# Patient Record
Sex: Female | Born: 1990 | Race: White | Hispanic: No | Marital: Single | Smoking: Former smoker
Health system: Southern US, Community
[De-identification: ages and names within clinical notes are randomized; demographics above are authoritative.]

## PROBLEM LIST (undated history)

## (undated) DIAGNOSIS — Z9889 Other specified postprocedural states: Secondary | ICD-10-CM

## (undated) DIAGNOSIS — F419 Anxiety disorder, unspecified: Secondary | ICD-10-CM

## (undated) DIAGNOSIS — T7840XA Allergy, unspecified, initial encounter: Secondary | ICD-10-CM

## (undated) DIAGNOSIS — G43909 Migraine, unspecified, not intractable, without status migrainosus: Secondary | ICD-10-CM

## (undated) HISTORY — DX: Other specified postprocedural states: Z98.890

## (undated) HISTORY — DX: Allergy, unspecified, initial encounter: T78.40XA

## (undated) HISTORY — PX: NO PAST SURGERIES: SHX2092

---
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
## 2017-11-19 ENCOUNTER — Ambulatory Visit: Payer: BC Managed Care – PPO | Admitting: Family Medicine

## 2017-11-19 ENCOUNTER — Other Ambulatory Visit: Payer: Self-pay

## 2017-11-19 ENCOUNTER — Encounter: Payer: Self-pay | Admitting: Family Medicine

## 2017-11-19 VITALS — BP 118/74 | HR 57 | Temp 98.5°F | Resp 16 | Ht 72.0 in | Wt 243.0 lb

## 2017-11-19 DIAGNOSIS — Z789 Other specified health status: Secondary | ICD-10-CM

## 2017-11-19 DIAGNOSIS — J301 Allergic rhinitis due to pollen: Secondary | ICD-10-CM

## 2017-11-19 DIAGNOSIS — J309 Allergic rhinitis, unspecified: Secondary | ICD-10-CM | POA: Insufficient documentation

## 2017-11-19 DIAGNOSIS — Z8371 Family history of colonic polyps: Secondary | ICD-10-CM

## 2017-11-19 DIAGNOSIS — S0300XA Dislocation of jaw, unspecified side, initial encounter: Secondary | ICD-10-CM | POA: Diagnosis not present

## 2017-11-19 DIAGNOSIS — IMO0001 Reserved for inherently not codable concepts without codable children: Secondary | ICD-10-CM | POA: Insufficient documentation

## 2017-11-19 DIAGNOSIS — F329 Major depressive disorder, single episode, unspecified: Secondary | ICD-10-CM

## 2017-11-19 DIAGNOSIS — G43909 Migraine, unspecified, not intractable, without status migrainosus: Secondary | ICD-10-CM | POA: Insufficient documentation

## 2017-11-19 DIAGNOSIS — F419 Anxiety disorder, unspecified: Secondary | ICD-10-CM

## 2017-11-19 DIAGNOSIS — G43709 Chronic migraine without aura, not intractable, without status migrainosus: Secondary | ICD-10-CM

## 2017-11-19 DIAGNOSIS — Z83719 Family history of colon polyps, unspecified: Secondary | ICD-10-CM

## 2017-11-19 MED ORDER — CITALOPRAM HYDROBROMIDE 20 MG PO TABS: 20.0000 mg | ORAL_TABLET | Freq: Every day | ORAL | 3 refills | Status: DC

## 2017-11-19 MED ORDER — NORETHINDRONE ACET-ETHINYL EST 1.5-30 MG-MCG PO TABS: 1.0000 | ORAL_TABLET | Freq: Every day | ORAL | 11 refills | Status: DC

## 2017-11-19 MED ORDER — NA SULFATE-K SULFATE-MG SULF 17.5-3.13-1.6 GM/177ML PO SOLN: 1.0000 | Freq: Once | ORAL | 0 refills | Status: AC

## 2017-11-19 MED ORDER — SUMATRIPTAN SUCCINATE 50 MG PO TABS: 50.0000 mg | ORAL_TABLET | ORAL | 5 refills | Status: DC | PRN

## 2017-11-19 NOTE — Assessment & Plan Note (Signed)
Well-controlled Continue Zyrtec as needed If she has any eustachian tube dysfunction symptoms, can consider Flonase as well

## 2017-11-19 NOTE — Assessment & Plan Note (Signed)
Discussed options for contraception with the patient The ease of a Guss Bunde is desirable, but the side effects are not desirable to the patient She will try OCPs again Discussed that she may be able to take these continuously and not have a monthly periods in the future if she desires She does not have aura with her migraines or any other contraindications

## 2017-11-19 NOTE — Assessment & Plan Note (Signed)
Patient sister found to have adenomas in her colon at age 25 It was recommended that first-degree relatives have screening with colonoscopy Referral to GI for colonoscopy and further management

## 2017-11-19 NOTE — Assessment & Plan Note (Signed)
Patient with obvious tenderness and clicking of her right TMJ It is likely that this is related to her grinding her teeth She will discuss with her dentist and possibly get a mouthguard to use when sleeping at night to help with this Advised to avoid clicking and popping her jaw This may be contributing to her migraines as above

## 2017-11-19 NOTE — Progress Notes (Signed)
Patient: Tammy Washington, Female    DOB: 1990-08-26, 27 y.o.   MRN: [id number] Visit Date: 11/19/2017  Today's Provider: Lavon Paganini, MD   Chief Complaint  Patient presents with  . New Patient (Initial Visit)   Subjective:    New Patient Tammy Washington is a 27 y.o. female who presents today as a new patient to establish care. She feels fairly well.  She reports exercising daily. Walks 2 miles at work. She reports she is sleeping well.  Allergic rhinitis: Symptoms are seasonal and seem to be related to pollen content in the air.  She is currently asymptomatic.  During the spring, she will take Zyrtec as needed.  Contraception: Patient is not currently sexually active, but she is recently engaged.  She is interested in starting contraception.  She has tried NuvaRing in the past, but felt "weird to use ".  She also had a Nexplanon for several years, but she did not like callus cause weight gain and irregular menses.  She likes having regular periods and is not interested in IUD.  She tried OCPs in the past and felt she was not responsible enough to take these consistently.  She believes that she may be responsible enough for this at this time.  She has no personal or family history of stroke, VTE, hypertension, migraine with aura    She would like to discuss migraine headaches that start in her right ear,  and is pounding. She also experiences lightheadedness.  The pain is unilateral and throbbing in nature.  She has both phonophobia and photophobia as well as dizziness and nausea.  She has not experienced vomiting.  She denies any aura.  She feels a clicking with moving her right jaw and thinks she may be a teeth grinder.  She has an appointment coming up with her dentist she states that previous physicians have tried cleaning out her ears but this has not helped she has never taken a triptan in the past.  When she gets the pain currently she will take 2 ibuprofen which does not seem  to help and then lay down in a dark room and try to sleep it off.  Her migraines are occurring about 1-2 times weekly right now and seem to be worse later in the day.  She is also concerned about her mood lately.  She has been experiencing anhedonia, lack of motivation, difficulty getting out of bed in the morning, worry and anxiety.  She states that her family moved away recently and this is been difficult.  She feels like she should not feel this way as life has been going really well.  She has never been diagnosed with depression or anxiety in the past.  She has never taken a medication for this.  She has never gone to therapy.  She has tried yoga, meditation, mindfulness, without much relief.  She states this is not affecting her job performance, but is affecting her social life. -----------------------------------------------------------------   Review of Systems  Constitutional: Negative.   HENT: Positive for ear pain. Negative for congestion, dental problem, drooling, ear discharge, facial swelling, hearing loss, mouth sores, nosebleeds, postnasal drip, rhinorrhea, sinus pressure, sinus pain, sneezing, sore throat, tinnitus, trouble swallowing and voice change.   Eyes: Negative.   Respiratory: Negative.   Cardiovascular: Negative.   Gastrointestinal: Negative.   Endocrine: Negative.   Genitourinary: Negative.   Musculoskeletal: Negative.   Skin: Negative.   Allergic/Immunologic: Negative.   Neurological: Positive for dizziness  and headaches. Negative for tremors, seizures, syncope, facial asymmetry, speech difficulty, weakness, light-headedness and numbness.  Hematological: Negative.   Psychiatric/Behavioral: Negative.     Social History      She  reports that she quit smoking about 2 years ago. Her smoking use included cigarettes. She quit after 3.00 years of use. She has never used smokeless tobacco. She reports that she drinks about 0.6 - 1.2 oz of alcohol per week. She reports that  she does not use drugs.       Social History   Socioeconomic History  . Marital status: Single    Spouse name: Not on file  . Number of children: 0  . Years of education: 16  . Highest education level: Bachelor's degree (e.g., BA, AB, BS)  Occupational History  . Occupation: Optometrist at [employer]: [place]  . Financial resource strain: Not on file  . Food insecurity:    Worry: Not on file    Inability: Not on file  . Transportation needs:    Medical: Not on file    Non-medical: Not on file  Tobacco Use  . Smoking status: Former Smoker    Years: 3.00    Types: Cigarettes    Last attempt to quit: 04/24/2015    Years since quitting: 2.5  . Smokeless tobacco: Never Used  . Tobacco comment: former social smoker  Substance and Sexual Activity  . Alcohol use: Yes    Alcohol/week: 0.6 - 1.2 oz    Types: 1 - 2 Cans of beer per week  . Drug use: Never  . Sexual activity: Not Currently    Partners: Male    Birth control/protection: Abstinence  Lifestyle  . Physical activity:    Days per week: Not on file    Minutes per session: Not on file  . Stress: Not on file  Relationships  . Social connections:    Talks on phone: Not on file    Gets together: Not on file    Attends religious service: Not on file    Active member of club or organization: Not on file    Attends meetings of clubs or organizations: Not on file    Relationship status: Not on file  Other Topics Concern  . Not on file  Social History Narrative  . Not on file    Past Medical History:  Diagnosis Date  . Allergy      Patient Active Problem List   Diagnosis Date Noted  . Allergic rhinitis 11/19/2017  . 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 Migraines 11/19/2017  . Family history of colonic polyps 11/19/2017  . Contraception 11/19/2017  . Anxiety and depression 11/19/2017    Past Surgical History:  Procedure Laterality Date  . NO PAST SURGERIES       Family History        Family Status  Relation Name Status  . Mother  Alive  . Father  Alive  . Sister  Alive  . MGM  (Not Specified)  . Sister  Alive  . Neg Hx  (Not Specified)        Her family history includes Colon polyps (age of onset: 26) in her sister; Dementia in her maternal grandmother; Healthy in her mother and sister; Rheum arthritis in her father. There is no history of Colon cancer, Breast cancer, Ovarian cancer, or Cervical cancer.      No Known Allergies   Current Outpatient Medications:  .  citalopram (CELEXA) 20 MG tablet, Take 1 tablet (20 mg total) by mouth daily., Disp: 30 tablet, Rfl: 3 .  Norethindrone Acetate-Ethinyl Estradiol (JUNEL 1.5/30) 1.5-30 MG-MCG tablet, Take 1 tablet by mouth daily., Disp: 1 Package, Rfl: 11 .  SUMAtriptan (IMITREX) 50 MG tablet, Take 1 tablet (50 mg total) by mouth every 2 (two) hours as needed for migraine. May repeat in 2 hours if headache persists or recurs., Disp: 10 tablet, Rfl: 5   Patient Care Team: Virginia Crews, MD as PCP - General (Family Medicine)      Objective:   Vitals: BP 118/74 (BP Location: Left Arm, Patient Position: Sitting, Cuff Size: Large)   Pulse (!) 57   Temp 98.5 F (36.9 C) (Oral)   Resp 16   Ht 6' (1.829 m)   Wt 243 lb (110.2 kg)   LMP 11/11/2017   SpO2 99%   BMI 32.96 kg/m    Vitals:   11/19/17 1409  BP: 118/74  Pulse: (!) 57  Resp: 16  Temp: 98.5 F (36.9 C)  TempSrc: Oral  SpO2: 99%  Weight: 243 lb (110.2 kg)  Height: 6' (1.829 m)     Physical Exam  Constitutional: She is oriented to person, place, and time. She appears well-developed and well-nourished. No distress.  HENT:  Head: Normocephalic and atraumatic.  Right Ear: Tympanic membrane, external ear and ear canal normal.  Left Ear: Tympanic membrane, external ear and ear canal normal.  Nose: Nose normal.  Mouth/Throat: Uvula is midline, oropharynx is clear and moist and mucous membranes are normal. Tonsils are  3+ on the right. Tonsils are 3+ on the left. No tonsillar exudate.  +clicking of L TMJ  Eyes: Pupils are equal, round, and reactive to light. Conjunctivae and EOM are normal. No scleral icterus.  Neck: Neck supple. No thyromegaly present.  Cardiovascular: Normal rate, regular rhythm, normal heart sounds and intact distal pulses.  No murmur heard. Pulmonary/Chest: Effort normal and breath sounds normal. No respiratory distress. She has no wheezes. She has no rales.  Abdominal: Soft. Bowel sounds are normal. She exhibits no distension. There is no tenderness. There is no rebound and no guarding.  Musculoskeletal: She exhibits no edema or deformity.  Lymphadenopathy:    She has no cervical adenopathy.  Neurological: She is alert and oriented to person, place, and time. She has normal strength. She displays no atrophy and no tremor. No cranial nerve deficit or sensory deficit. She exhibits normal muscle tone. Coordination and gait normal.  Skin: Skin is warm and dry. Capillary refill takes less than 2 seconds. No rash noted.  Psychiatric: Her speech is normal and behavior is normal. Judgment normal. Her mood appears anxious. Her affect is not angry and not inappropriate. Cognition and memory are normal. She exhibits a depressed mood. She expresses no homicidal and no suicidal ideation. She expresses no suicidal plans and no homicidal plans.  Vitals reviewed.   Depression screen PHQ 2/9 11/19/2017  Decreased Interest 2  Down, Depressed, Hopeless 1  PHQ - 2 Score 3  Altered sleeping 2  Tired, decreased energy 2  Change in appetite 1  Feeling bad or failure about yourself  2  Trouble concentrating 0  Moving slowly or fidgety/restless 0  Suicidal thoughts 0  PHQ-9 Score 10  Difficult doing work/chores Somewhat difficult    GAD 7 : Generalized Anxiety Score 11/19/2017  Nervous, Anxious, on Edge 2  Control/stop worrying 2  Worry too much - different things 2  Trouble relaxing  2  Restless 2    Easily annoyed or irritable 3  Afraid - awful might happen 2  Total GAD 7 Score 15  Anxiety Difficulty Very difficult     Assessment & Plan:    Problem List Items Addressed This Visit      Cardiovascular and Mediastinum   Migraines - Primary    Patient previously diagnosed with migraines They are more chronic at this point She has no aura Her neuro exam is benign today She does seem to have classic migrainous symptoms that are associated with her headache 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-see below for treatment of that I will prescribe a triptan to use as needed at first onset of migraines Discussed return precautions If continue to be frequent, could consider preventive medication      Relevant Medications   SUMAtriptan (IMITREX) 50 MG tablet   citalopram (CELEXA) 20 MG tablet     Respiratory   Allergic rhinitis    Well-controlled Continue Zyrtec as needed If she has any eustachian tube dysfunction symptoms, can consider Flonase as well        Musculoskeletal and Integument   TMJ (dislocation of temporomandibular joint)    Patient with obvious tenderness and clicking of her right TMJ It is likely that this is related to her grinding her teeth She will discuss with her dentist and possibly get a mouthguard to use when sleeping at night to help with this Advised to avoid clicking and popping her jaw This may be contributing to her migraines as above        Other   Family history of colonic polyps    Patient sister found to have adenomas in her colon at age 43 It was recommended that first-degree relatives have screening with colonoscopy Referral to GI for colonoscopy and further management       Relevant Orders   Ambulatory referral to Gastroenterology   Contraception    Discussed options for contraception with the patient The ease of a Guss Bunde is desirable, but the side effects are not desirable to the patient She will try  OCPs again Discussed that she may be able to take these continuously and not have a monthly periods in the future if she desires She does not have aura with her migraines or any other contraindications      Anxiety and depression    New diagnoses Symptoms are consistent with depression anxiety we will get labs at her next visit to screen for any secondary causes of depression anxiety Advised on the synergistic effect of medications and therapy Patient to look into starting to see a therapist She is agreeable to starting medication at this time We will start Celexa 20 mg daily Discussed that it can take 6 to 8 weeks to reach full efficacy Discussed possible side effects including GI upset and sexual dysfunction, as well as black box warning for increased suicidality Contracted for safety with no SI or HI today  follow-up in 6 weeks and consider dose titration of Celexa      Relevant Medications   citalopram (CELEXA) 20 MG tablet       Return in about 6 weeks (around 12/31/2017) for mood and migraines.  Addressed extensive list of chronic and acute medical problems today requiring extensive time in counseling and coordination of care.  Over half of this 60 minute visit were spent in counseling and coordinating care of multiple medical problems.  The entirety of  the information documented in the History of Present Illness, Review of Systems and Physical Exam were personally obtained by me. Portions of this information were initially documented by Raquel Sarna Ratchford, CMA and reviewed by me for thoroughness and accuracy.    Virginia Crews, MD, MPH [facility] 11/19/2017 4:45 PM

## 2017-11-19 NOTE — Assessment & Plan Note (Signed)
New diagnoses Symptoms are consistent with depression anxiety we will get labs at her next visit to screen for any secondary causes of depression anxiety Advised on the synergistic effect of medications and therapy Patient to look into starting to see a therapist She is agreeable to starting medication at this time We will start Celexa 20 mg daily Discussed that it can take 6 to 8 weeks to reach full efficacy Discussed possible side effects including GI upset and sexual dysfunction, as well as black box warning for increased suicidality Contracted for safety with no SI or HI today  follow-up in 6 weeks and consider dose titration of Celexa

## 2017-11-19 NOTE — Patient Instructions (Addendum)
[email or web address] Use this to find a therapist   Temporomandibular Joint Syndrome Temporomandibular joint (TMJ) syndrome is a condition that affects the joints between your jaw and your skull. The TMJs are located near your ears and allow your jaw to open and close. These joints and the nearby muscles are involved in all movements of the jaw. People with TMJ syndrome have pain in the area of these joints and muscles. Chewing, biting, or other movements of the jaw can be difficult or painful. TMJ syndrome can be caused by various things. In many cases, the condition is mild and goes away within a few weeks. For some people, the condition can become a long-term problem. What are the causes? Possible causes of TMJ syndrome include:  Grinding your teeth or clenching your jaw. Some people do this when they are under stress.  Arthritis.  Injury to the jaw.  Head or neck injury.  Teeth or dentures that are not aligned well.  In some cases, the cause of TMJ syndrome may not be known. What are the signs or symptoms? The most common symptom is an aching pain on the side of the head in the area of the TMJ. Other symptoms may include:  Pain when moving your jaw, such as when chewing or biting.  Being unable to open your jaw all the way.  Making a clicking sound when you open your mouth.  Headache.  Earache.  Neck or shoulder pain.  How is this diagnosed? Diagnosis can usually be made based on your symptoms, your medical history, and a physical exam. Your health care provider may check the range of motion of your jaw. Imaging tests, such as X-rays or an MRI, are sometimes done. You may need to see your dentist to determine if your teeth and jaw are lined up correctly. How is this treated? TMJ syndrome often goes away on its own. If treatment is needed, the options may include:  Eating soft foods and applying ice or heat.  Medicines to relieve pain or inflammation.  Medicines to  relax the muscles.  A splint, bite plate, or mouthpiece to prevent teeth grinding or jaw clenching.  Relaxation techniques or counseling to help reduce stress.  Transcutaneous electrical nerve stimulation (TENS). This helps to relieve pain by applying an electrical current through the skin.  Acupuncture. This is sometimes helpful to relieve pain.  Jaw surgery. This is rarely needed.  Follow these instructions at home:  Take medicines only as directed by your health care provider.  Eat a soft diet if you are having trouble chewing.  Apply ice to the painful area. ? Put ice in a plastic bag. ? Place a towel between your skin and the bag. ? Leave the ice on for 20 minutes, 2-3 times a day.  Apply a warm compress to the painful area as directed.  Massage your jaw area and perform any jaw stretching exercises as recommended by your health care provider.  If you were given a mouthpiece or bite plate, wear it as directed.  Avoid foods that require a lot of chewing. Do not chew gum.  Keep all follow-up visits as directed by your health care provider. This is important. Contact a health care provider if:  You are having trouble eating.  You have new or worsening symptoms. Get help right away if:  Your jaw locks open or closed. This information is not intended to replace advice given to you by your health care provider. Make sure you discuss any  questions you have with your health care provider. Document Released: 01/03/2001 Document Revised: 12/09/2015 Document Reviewed: 11/13/2013 Elsevier Interactive Patient Education  [employer].

## 2017-11-19 NOTE — Assessment & Plan Note (Signed)
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

## 2017-11-21 ENCOUNTER — Other Ambulatory Visit: Payer: Self-pay

## 2017-11-21 ENCOUNTER — Encounter: Payer: Self-pay | Admitting: *Deleted

## 2017-12-05 ENCOUNTER — Encounter: Payer: Self-pay | Admitting: *Deleted

## 2017-12-05 ENCOUNTER — Other Ambulatory Visit: Payer: Self-pay

## 2017-12-11 NOTE — Discharge Instructions (Signed)
General Anesthesia, Adult, Care After °These instructions provide you with information about caring for yourself after your procedure. Your health care provider may also give you more specific instructions. Your treatment has been planned according to current medical practices, but problems sometimes occur. Call your health care provider if you have any problems or questions after your procedure. °What can I expect after the procedure? °After the procedure, it is common to have: °· Vomiting. °· A sore throat. °· Mental slowness. ° °It is common to feel: °· Nauseous. °· Cold or shivery. °· Sleepy. °· Tired. °· Sore or achy, even in parts of your body where you did not have surgery. ° °Follow these instructions at home: °For at least 24 hours after the procedure: °· Do not: °? Participate in activities where you could fall or become injured. °? Drive. °? Use heavy machinery. °? Drink alcohol. °? Take sleeping pills or medicines that cause drowsiness. °? Make important decisions or sign legal documents. °? Take care of children on your own. °· Rest. °Eating and drinking °· If you vomit, drink water, juice, or soup when you can drink without vomiting. °· Drink enough fluid to keep your urine clear or pale yellow. °· Make sure you have little or no nausea before eating solid foods. °· Follow the diet recommended by your health care provider. °General instructions °· Have a responsible adult stay with you until you are awake and alert. °· Return to your normal activities as told by your health care provider. Ask your health care provider what activities are safe for you. °· Take over-the-counter and prescription medicines only as told by your health care provider. °· If you smoke, do not smoke without supervision. °· Keep all follow-up visits as told by your health care provider. This is important. °Contact a health care provider if: °· You continue to have nausea or vomiting at home, and medicines are not helpful. °· You  cannot drink fluids or start eating again. °· You cannot urinate after 8-12 hours. °· You develop a skin rash. °· You have fever. °· You have increasing redness at the site of your procedure. °Get help right away if: °· You have difficulty breathing. °· You have chest pain. °· You have unexpected bleeding. °· You feel that you are having a life-threatening or urgent problem. °This information is not intended to replace advice given to you by your health care provider. Make sure you discuss any questions you have with your health care provider. °Document Released: 07/17/2000 Document Revised: 09/13/2015 Document Reviewed: 03/25/2015 °Elsevier Interactive Patient Education © 2018 Elsevier Inc. ° °

## 2017-12-12 ENCOUNTER — Ambulatory Visit: Payer: BC Managed Care – PPO | Admitting: Anesthesiology

## 2017-12-12 ENCOUNTER — Ambulatory Visit
Admission: RE | Admit: 2017-12-12 | Discharge: 2017-12-12 | Disposition: A | Discharge status: Home / Self Care | Payer: BC Managed Care – PPO | Source: Ambulatory Visit | Attending: Gastroenterology | Admitting: Gastroenterology

## 2017-12-12 ENCOUNTER — Ambulatory Visit
Admission: RE | Disposition: A | Discharge status: Home / Self Care | Payer: Self-pay | Source: Ambulatory Visit | Attending: Gastroenterology

## 2017-12-12 DIAGNOSIS — Z8371 Family history of colonic polyps: Secondary | ICD-10-CM | POA: Insufficient documentation

## 2017-12-12 DIAGNOSIS — Z79899 Other long term (current) drug therapy: Secondary | ICD-10-CM | POA: Diagnosis not present

## 2017-12-12 DIAGNOSIS — Z87891 Personal history of nicotine dependence: Secondary | ICD-10-CM | POA: Diagnosis not present

## 2017-12-12 DIAGNOSIS — Z1211 Encounter for screening for malignant neoplasm of colon: Secondary | ICD-10-CM | POA: Insufficient documentation

## 2017-12-12 DIAGNOSIS — Z83719 Family history of colon polyps, unspecified: Secondary | ICD-10-CM

## 2017-12-12 HISTORY — PX: COLONOSCOPY WITH PROPOFOL: SHX5780

## 2017-12-12 HISTORY — DX: Migraine, unspecified, not intractable, without status migrainosus: G43.909

## 2017-12-12 SURGERY — COLONOSCOPY WITH PROPOFOL
Anesthesia: General | Wound class: Clean Contaminated

## 2017-12-12 MED ORDER — ACETAMINOPHEN 160 MG/5ML PO SOLN: 325.0000 mg | Freq: Once | ORAL | Status: DC

## 2017-12-12 MED ORDER — STERILE WATER FOR IRRIGATION IR SOLN
Status: DC | PRN
  Administered 2017-12-12: 10:00:00

## 2017-12-12 MED ORDER — LIDOCAINE HCL (CARDIAC) PF 100 MG/5ML IV SOSY
PREFILLED_SYRINGE | INTRAVENOUS | Status: DC | PRN
  Administered 2017-12-12: 40 mg via INTRAVENOUS

## 2017-12-12 MED ORDER — PROPOFOL 10 MG/ML IV BOLUS
INTRAVENOUS | Status: DC | PRN
  Administered 2017-12-12: 30 mg via INTRAVENOUS
  Administered 2017-12-12 (×2): 20 mg via INTRAVENOUS
  Administered 2017-12-12: 100 mg via INTRAVENOUS
  Administered 2017-12-12: 20 mg via INTRAVENOUS
  Administered 2017-12-12: 50 mg via INTRAVENOUS
  Administered 2017-12-12: 30 mg via INTRAVENOUS
  Administered 2017-12-12: 20 mg via INTRAVENOUS
  Administered 2017-12-12: 50 mg via INTRAVENOUS
  Administered 2017-12-12: 20 mg via INTRAVENOUS
  Administered 2017-12-12: 10 mg via INTRAVENOUS

## 2017-12-12 MED ORDER — LACTATED RINGERS IV SOLN
INTRAVENOUS | Status: DC
  Administered 2017-12-12: 09:00:00 via INTRAVENOUS

## 2017-12-12 MED ORDER — ACETAMINOPHEN 325 MG PO TABS: 325.0000 mg | ORAL_TABLET | Freq: Once | ORAL | Status: DC

## 2017-12-12 MED ORDER — GLYCOPYRROLATE 0.2 MG/ML IJ SOLN
INTRAMUSCULAR | Status: DC | PRN
  Administered 2017-12-12: 0.2 mg via INTRAVENOUS

## 2017-12-12 MED ORDER — SODIUM CHLORIDE 0.9 % IV SOLN: INTRAVENOUS | Status: DC

## 2017-12-12 SURGICAL SUPPLY — 25 items
CANISTER SUCT 1200ML W/VALVE (MISCELLANEOUS) ×3 IMPLANT
CLIP HMST 235XBRD CATH ROT (MISCELLANEOUS) IMPLANT
CLIP RESOLUTION 360 11X235 (MISCELLANEOUS)
ELECT REM PT RETURN 9FT ADLT (ELECTROSURGICAL)
ELECTRODE REM PT RTRN 9FT ADLT (ELECTROSURGICAL) IMPLANT
FCP ESCP3.2XJMB 240X2.8X (MISCELLANEOUS)
FORCEPS BIOP RAD 4 LRG CAP 4 (CUTTING FORCEPS) IMPLANT
FORCEPS BIOP RJ4 240 W/NDL (MISCELLANEOUS)
FORCEPS ESCP3.2XJMB 240X2.8X (MISCELLANEOUS) IMPLANT
GOWN CVR UNV OPN BCK APRN NK (MISCELLANEOUS) ×2 IMPLANT
GOWN ISOL THUMB LOOP REG UNIV (MISCELLANEOUS) ×4
INJECTOR VARIJECT VIN23 (MISCELLANEOUS) IMPLANT
KIT DEFENDO VALVE AND CONN (KITS) IMPLANT
KIT ENDO PROCEDURE OLY (KITS) ×3 IMPLANT
MARKER SPOT ENDO TATTOO 5ML (MISCELLANEOUS) IMPLANT
PROBE APC STR FIRE (PROBE) IMPLANT
RETRIEVER NET ROTH 2.5X230 LF (MISCELLANEOUS) IMPLANT
SNARE COLD EXACTO (MISCELLANEOUS) IMPLANT
SNARE SHORT THROW 13M SML OVAL (MISCELLANEOUS) IMPLANT
SNARE SHORT THROW 30M LRG OVAL (MISCELLANEOUS) IMPLANT
SNARE SNG USE RND 15MM (INSTRUMENTS) IMPLANT
SPOT EX ENDOSCOPIC TATTOO (MISCELLANEOUS)
TRAP ETRAP POLY (MISCELLANEOUS) IMPLANT
VARIJECT INJECTOR VIN23 (MISCELLANEOUS)
WATER STERILE IRR 250ML POUR (IV SOLUTION) ×3 IMPLANT

## 2017-12-12 NOTE — Transfer of Care (Signed)
Immediate Anesthesia Transfer of Care Note  Patient: Tammy Washington  Procedure(s) Performed: COLONOSCOPY WITH PROPOFOL (N/A )  Patient Location: PACU  Anesthesia Type: General  Level of Consciousness: awake, alert  and patient cooperative  Airway and Oxygen Therapy: Patient Spontanous Breathing and Patient connected to supplemental oxygen  Post-op Assessment: Post-op Vital signs reviewed, Patient's Cardiovascular Status Stable, Respiratory Function Stable, Patent Airway and No signs of Nausea or vomiting  Post-op Vital Signs: Reviewed and stable  Complications: No apparent anesthesia complications

## 2017-12-12 NOTE — Anesthesia Procedure Notes (Signed)
Procedure Name: MAC Date/Time: 12/12/2017 9:40 AM Performed by: Janna Arch, CRNA Pre-anesthesia Checklist: Patient identified, Emergency Drugs available, Suction available and Patient being monitored Patient Re-evaluated:Patient Re-evaluated prior to induction Oxygen Delivery Method: Nasal cannula

## 2017-12-12 NOTE — Anesthesia Postprocedure Evaluation (Signed)
Anesthesia Post Note  Patient: Tammy Washington  Procedure(s) Performed: COLONOSCOPY WITH PROPOFOL (N/A )  Patient location during evaluation: PACU Anesthesia Type: General Level of consciousness: awake and alert and oriented Pain management: satisfactory to patient Vital Signs Assessment: post-procedure vital signs reviewed and stable Respiratory status: spontaneous breathing, nonlabored ventilation and respiratory function stable Cardiovascular status: blood pressure returned to baseline and stable Postop Assessment: Adequate PO intake and No signs of nausea or vomiting Anesthetic complications: no    Raliegh Ip

## 2017-12-12 NOTE — H&P (Signed)
Tammy Darby, MD [address]  [place]  [place], [place] [address]  Main: [phone]  Fax: [phone] Pager: [phone]  Primary Care Physician:  Virginia Crews, MD Primary Gastroenterologist:  Dr. Cephas Washington  Pre-Procedure History & Physical: HPI:  Tammy Washington is a 27 y.o. female is here for an colonoscopy.   Past Medical History:  Diagnosis Date  . Allergy   . Migraine headache     Past Surgical History:  Procedure Laterality Date  . NO PAST SURGERIES      Prior to Admission medications   Medication Sig Start Date End Date Taking? Authorizing Provider  citalopram (CELEXA) 20 MG tablet Take 1 tablet (20 mg total) by mouth daily. 11/19/17  Yes Bacigalupo, Dionne Bucy, MD  Norethindrone Acetate-Ethinyl Estradiol (JUNEL 1.5/30) 1.5-30 MG-MCG tablet Take 1 tablet by mouth daily. 11/19/17  Yes Bacigalupo, Dionne Bucy, MD  SUMAtriptan (IMITREX) 50 MG tablet Take 1 tablet (50 mg total) by mouth every 2 (two) hours as needed for migraine. May repeat in 2 hours if headache persists or recurs. 11/19/17  Yes Virginia Crews, MD    Allergies as of 11/19/2017  . (No Known Allergies)    Family History  Problem Relation Age of Onset  . Healthy Mother   . Rheum arthritis Father   . Colon polyps Sister 64       serrated adenoma  . Dementia Maternal Grandmother   . Healthy Sister   . Colon cancer Neg Hx   . Breast cancer Neg Hx   . Ovarian cancer Neg Hx   . Cervical cancer Neg Hx     Social History   Socioeconomic History  . Marital status: Single    Spouse name: Not on file  . Number of children: 0  . Years of education: 16  . Highest education level: Bachelor's degree (e.g., BA, AB, BS)  Occupational History  . Occupation: Optometrist at [employer] [place]  . Financial resource strain: Not on file  . Food insecurity:    Worry: Not on file    Inability: Not on file  . Transportation needs:    Medical: Not on  file    Non-medical: Not on file  Tobacco Use  . Smoking status: Former Smoker    Years: 3.00    Types: Cigarettes    Last attempt to quit: 04/24/2015    Years since quitting: 2.6  . Smokeless tobacco: Never Used  . Tobacco comment: former social smoker  Substance and Sexual Activity  . Alcohol use: Yes    Alcohol/week: 1.0 - 2.0 standard drinks    Types: 1 - 2 Cans of beer per week  . Drug use: Never  . Sexual activity: Not Currently    Partners: Male    Birth control/protection: Abstinence  Lifestyle  . Physical activity:    Days per week: Not on file    Minutes per session: Not on file  . Stress: Not on file  Relationships  . Social connections:    Talks on phone: Not on file    Gets together: Not on file    Attends religious service: Not on file    Active member of club or organization: Not on file    Attends meetings of clubs or organizations: Not on file    Relationship status: Not on file  . Intimate partner violence:    Fear of current or ex partner: Not on  file    Emotionally abused: Not on file    Physically abused: Not on file    Forced sexual activity: Not on file  Other Topics Concern  . Not on file  Social History Narrative  . Not on file    Review of Systems: See HPI, otherwise negative ROS  Physical Exam: BP 115/78   Pulse 64   Temp 97.9 F (36.6 C) (Temporal)   Resp 16   Ht 6' (1.829 m)   Wt 106.6 kg   LMP 12/12/2017 Comment: preg test neg  SpO2 99%   BMI 31.87 kg/m  General:   Alert,  pleasant and cooperative in NAD Head:  Normocephalic and atraumatic. Neck:  Supple; no masses or thyromegaly. Lungs:  Clear throughout to auscultation.    Heart:  Regular rate and rhythm. Abdomen:  Soft, nontender and nondistended. Normal bowel sounds, without guarding, and without rebound.   Neurologic:  Alert and  oriented x4;  grossly normal neurologically.  Impression/Plan: Tammy Washington is here for an colonoscopy to be performed for family h/o  colon polyps  Risks, benefits, limitations, and alternatives regarding  colonoscopy have been reviewed with the patient.  Questions have been answered.  All parties agreeable.   Sherri Sear, MD  12/12/2017, 9:30 AM

## 2017-12-12 NOTE — Anesthesia Preprocedure Evaluation (Signed)
Anesthesia Evaluation  Patient identified by MRN, date of birth, ID band Patient awake    Reviewed: Allergy & Precautions, H&P , NPO status , Patient's Chart, lab work & pertinent test results  Airway Mallampati: II  TM Distance: >3 FB Neck ROM: full    Dental no notable dental hx.    Pulmonary former smoker,    Pulmonary exam normal breath sounds clear to auscultation       Cardiovascular Normal cardiovascular exam Rhythm:regular Rate:Normal     Neuro/Psych    GI/Hepatic   Endo/Other    Renal/GU      Musculoskeletal   Abdominal   Peds  Hematology   Anesthesia Other Findings   Reproductive/Obstetrics                             Anesthesia Physical Anesthesia Plan  ASA: II  Anesthesia Plan: General   Post-op Pain Management:    Induction: Intravenous  PONV Risk Score and Plan: 3 and Propofol infusion and Treatment may vary due to age or medical condition  Airway Management Planned: Natural Airway  Additional Equipment:   Intra-op Plan:   Post-operative Plan:   Informed Consent: I have reviewed the patients History and Physical, chart, labs and discussed the procedure including the risks, benefits and alternatives for the proposed anesthesia with the patient or authorized representative who has indicated his/her understanding and acceptance.     Plan Discussed with: CRNA  Anesthesia Plan Comments:         Anesthesia Quick Evaluation

## 2017-12-12 NOTE — Op Note (Signed)
[facility] Gastroenterology Patient Name: Tammy Washington Procedure Date: 12/12/2017 9:30 AM MRN: [id number] Account #: [id number] Date of Birth: 04-Jul-1990 Admit Type: Outpatient Age: 27 Room: [facility] OR ROOM 01 Gender: Female Note Status: Finalized Procedure:            Colonoscopy Indications:          Colon cancer screening in patient with 1st-degree                        relative having sessile serrated colon polyp (smaller                        than 10 mm with no dysplasia) before age 52 Providers:            Rohini Raeanne Gathers MD, MD Referring MD:         Dionne Bucy. Bacigalupo (Referring MD) Medicines:            Monitored Anesthesia Care Complications:        No immediate complications. Estimated blood loss: None. Procedure:            Pre-Anesthesia Assessment:                       - Prior to the procedure, a History and Physical was                        performed, and patient medications and allergies were                        reviewed. The patient is competent. The risks and                        benefits of the procedure and the sedation options and                        risks were discussed with the patient. All questions                        were answered and informed consent was obtained.                        Patient identification and proposed procedure were                        verified by the physician, the nurse, the                        anesthesiologist, the anesthetist and the technician in                        the pre-procedure area in the procedure room in the                        endoscopy suite. Mental Status Examination: alert and                        oriented. Airway Examination: normal oropharyngeal                        airway and neck mobility. Respiratory Examination:  clear to auscultation. CV Examination: normal.                        Prophylactic Antibiotics: The patient does not require                         prophylactic antibiotics. Prior Anticoagulants: The                        patient has taken no previous anticoagulant or                        antiplatelet agents. ASA Grade Assessment: II - A                        patient with mild systemic disease. After reviewing the                        risks and benefits, the patient was deemed in                        satisfactory condition to undergo the procedure. The                        anesthesia plan was to use monitored anesthesia care                        (MAC). Immediately prior to administration of                        medications, the patient was re-assessed for adequacy                        to receive sedatives. The heart rate, respiratory rate,                        oxygen saturations, blood pressure, adequacy of                        pulmonary ventilation, and response to care were                        monitored throughout the procedure. The physical status                        of the patient was re-assessed after the procedure.                       After obtaining informed consent, the colonoscope was                        passed under direct vision. Throughout the procedure,                        the patient's blood pressure, pulse, and oxygen                        saturations were monitored continuously. The was  introduced through the anus and advanced to the the                        terminal ileum. The colonoscopy was performed without                        difficulty. The patient tolerated the procedure well.                        The quality of the bowel preparation was adequate to                        identify polyps 6 mm and larger in size. Findings:      The perianal and digital rectal examinations were normal. Pertinent       negatives include normal sphincter tone and no palpable rectal lesions.      The terminal ileum appeared normal.      The colon  (entire examined portion) appeared normal.      The retroflexed view of the distal rectum and anal verge was normal and       showed no anal or rectal abnormalities. Impression:           - The examined portion of the ileum was normal.                       - The entire examined colon is normal.                       - The distal rectum and anal verge are normal on                        retroflexion view.                       - No specimens collected. Recommendation:       - Discharge patient to home (with escort).                       - Resume regular diet today.                       - Continue present medications.                       - Repeat colonoscopy at age 38 for surveillance. Procedure Code(s):    --- Professional ---                       L3810, Colorectal cancer screening; colonoscopy on                        individual at high risk Diagnosis Code(s):    --- Professional ---                       Z83.71, Family history of colonic polyps CPT copyright 2017 American Medical Association. All rights reserved. The codes documented in this report are preliminary and upon coder review may  be revised to meet current compliance requirements. Dr. Ulyess Mort Lin Landsman MD, MD 12/12/2017 10:04:20 AM This report has been signed electronically. Number of Addenda: 0 Note Initiated On: 12/12/2017 9:30  AM Scope Withdrawal Time: 0 hours 11 minutes 45 seconds  Total Procedure Duration: 0 hours 14 minutes 26 seconds       [facility]

## 2017-12-13 ENCOUNTER — Encounter: Payer: Self-pay | Admitting: Gastroenterology

## 2017-12-31 ENCOUNTER — Ambulatory Visit: Payer: Self-pay | Admitting: Family Medicine

## 2017-12-31 NOTE — Progress Notes (Deleted)
       Patient: Tammy Washington Female    DOB: Oct 15, 1990   27 y.o.   MRN: [id number] Visit Date: 12/31/2017  Today's Provider: Lavon Paganini, MD   No chief complaint on file.  Subjective:    I, Tiburcio Pea, CMA, am acting as a scribe for Lavon Paganini, MD.   HPI Anxiety & Depression Follow Up:  Patient presents for a 6 week follow up. Last OV was on 11/19/2017. Patient started Celexa 20 mg. She reports * compliance with treatment plan. She states symptoms are     No Known Allergies   Current Outpatient Medications:  .  citalopram (CELEXA) 20 MG tablet, Take 1 tablet (20 mg total) by mouth daily., Disp: 30 tablet, Rfl: 3 .  Norethindrone Acetate-Ethinyl Estradiol (JUNEL 1.5/30) 1.5-30 MG-MCG tablet, Take 1 tablet by mouth daily., Disp: 1 Package, Rfl: 11 .  SUMAtriptan (IMITREX) 50 MG tablet, Take 1 tablet (50 mg total) by mouth every 2 (two) hours as needed for migraine. May repeat in 2 hours if headache persists or recurs., Disp: 10 tablet, Rfl: 5  Review of Systems  Constitutional: Negative.   Respiratory: Negative.   Cardiovascular: Negative.   Musculoskeletal: Negative.   Psychiatric/Behavioral: The patient is nervous/anxious.        Depression     Social History   Tobacco Use  . Smoking status: Former Smoker    Years: 3.00    Types: Cigarettes    Last attempt to quit: 04/24/2015    Years since quitting: 2.6  . Smokeless tobacco: Never Used  . Tobacco comment: former social smoker  Substance Use Topics  . Alcohol use: Yes    Alcohol/week: 1.0 - 2.0 standard drinks    Types: 1 - 2 Cans of beer per week   Objective:   LMP 12/12/2017 Comment: preg test neg There were no vitals filed for this visit.   Physical Exam      Assessment & Plan:           Lavon Paganini, MD  [facility]

## 2018-01-07 ENCOUNTER — Encounter: Payer: Self-pay | Admitting: Family Medicine

## 2018-01-07 ENCOUNTER — Ambulatory Visit (INDEPENDENT_AMBULATORY_CARE_PROVIDER_SITE_OTHER): Payer: BC Managed Care – PPO | Admitting: Family Medicine

## 2018-01-07 VITALS — BP 142/94 | HR 86 | Temp 98.5°F | Wt 244.4 lb

## 2018-01-07 DIAGNOSIS — R030 Elevated blood-pressure reading, without diagnosis of hypertension: Secondary | ICD-10-CM

## 2018-01-07 DIAGNOSIS — J029 Acute pharyngitis, unspecified: Secondary | ICD-10-CM | POA: Diagnosis not present

## 2018-01-07 MED ORDER — AMOXICILLIN 500 MG PO CAPS: 500.0000 mg | ORAL_CAPSULE | Freq: Two times a day (BID) | ORAL | 0 refills | Status: AC

## 2018-01-07 NOTE — Progress Notes (Signed)
Patient: Tammy Washington Female    DOB: Feb 05, 1991   27 y.o.   MRN: [id number] Visit Date: 01/07/2018  Today's Provider: Lavon Paganini, MD   Chief Complaint  Patient presents with  . URI   Subjective:    I, Tiburcio Pea, CMA, am acting as a scribe for Lavon Paganini, MD.   URI   This is a new problem. Episode onset: 5 days ago. The problem has been gradually worsening. There has been no fever. Associated symptoms include congestion, coughing, ear pain and a sore throat. Associated symptoms comments: Fatigue, hoarseness, difficulty sleeping . Treatments tried: Mucinex, DyQuil, NyQuil, and Ibuprofen. The treatment provided no relief.   Works at [employer].  Almost everyone at work is sick with similar symptoms.    No Known Allergies   Current Outpatient Medications:  .  citalopram (CELEXA) 20 MG tablet, Take 1 tablet (20 mg total) by mouth daily., Disp: 30 tablet, Rfl: 3 .  Norethindrone Acetate-Ethinyl Estradiol (JUNEL 1.5/30) 1.5-30 MG-MCG tablet, Take 1 tablet by mouth daily., Disp: 1 Package, Rfl: 11 .  SUMAtriptan (IMITREX) 50 MG tablet, Take 1 tablet (50 mg total) by mouth every 2 (two) hours as needed for migraine. May repeat in 2 hours if headache persists or recurs., Disp: 10 tablet, Rfl: 5  Review of Systems  Constitutional: Negative.   HENT: Positive for congestion, ear pain and sore throat.   Respiratory: Positive for cough.   Cardiovascular: Negative.   Musculoskeletal: Negative.     Social History   Tobacco Use  . Smoking status: Former Smoker    Years: 3.00    Types: Cigarettes    Last attempt to quit: 04/24/2015    Years since quitting: 2.7  . Smokeless tobacco: Never Used  . Tobacco comment: former social smoker  Substance Use Topics  . Alcohol use: Yes    Alcohol/week: 1.0 - 2.0 standard drinks    Types: 1 - 2 Cans of beer per week   Objective:   BP (!) 152/98 (BP Location: Right Arm, Patient Position: Sitting, Cuff Size: Large)    Pulse 86   Temp 98.5 F (36.9 C) (Oral)   Wt 244 lb 6.4 oz (110.9 kg)   LMP 12/12/2017 Comment: preg test neg  SpO2 98%   BMI 33.15 kg/m  Vitals:   01/07/18 1448  BP: (!) 152/98  Pulse: 86  Temp: 98.5 F (36.9 C)  TempSrc: Oral  SpO2: 98%  Weight: 244 lb 6.4 oz (110.9 kg)     Physical Exam  Constitutional: She is oriented to person, place, and time. She appears well-developed and well-nourished. No distress.  HENT:  Head: Normocephalic and atraumatic.  Right Ear: Tympanic membrane, external ear and ear canal normal.  Left Ear: Tympanic membrane, external ear and ear canal normal.  Nose: Mucosal edema present. Right sinus exhibits no maxillary sinus tenderness and no frontal sinus tenderness. Left sinus exhibits no maxillary sinus tenderness and no frontal sinus tenderness.  Mouth/Throat: Uvula is midline and mucous membranes are normal. Posterior oropharyngeal erythema present. No oropharyngeal exudate or posterior oropharyngeal edema. Tonsils are 3+ on the right. Tonsils are 3+ on the left.  Eyes: Pupils are equal, round, and reactive to light. Conjunctivae are normal. Right eye exhibits no discharge. Left eye exhibits no discharge. No scleral icterus.  Neck: Neck supple. No thyromegaly present.  Cardiovascular: Normal rate, regular rhythm, normal heart sounds and intact distal pulses.  No murmur heard. Pulmonary/Chest: Effort normal and  breath sounds normal. No respiratory distress. She has no wheezes. She has no rales.  Musculoskeletal: She exhibits no edema.  Lymphadenopathy:    She has no cervical adenopathy.  Neurological: She is alert and oriented to person, place, and time.  Skin: Skin is warm and dry. Capillary refill takes less than 2 seconds. No rash noted.  Psychiatric: She has a normal mood and affect. Her behavior is normal.  Vitals reviewed.      Assessment & Plan:   1. Acute pharyngitis, unspecified etiology - recurrent problem - likely related to  enlarged tonsils - though tonsils are large, they are at baseline - concern for possible bacterial process - will treat with amoxicillin x7d - discussed symptomatic management and return precautions - if continues to have recurrent infections, may need to see ENT to consider tonsillectomy  2. Elevated BP without diagnosis of hypertension - likely 2/2 decongestants and illness - will monitor it - no red flag symptoms   Meds ordered this encounter  Medications  . amoxicillin (AMOXIL) 500 MG capsule    Sig: Take 1 capsule (500 mg total) by mouth 2 (two) times daily for 7 days.    Dispense:  14 capsule    Refill:  0     Return if symptoms worsen or fail to improve.   The entirety of the information documented in the History of Present Illness, Review of Systems and Physical Exam were personally obtained by me. Portions of this information were initially documented by Tiburcio Pea, CMA and reviewed by me for thoroughness and accuracy.    Virginia Crews, MD, MPH [facility] 01/07/2018 3:26 PM

## 2018-01-07 NOTE — Patient Instructions (Signed)

## 2018-01-10 ENCOUNTER — Telehealth: Payer: Self-pay | Admitting: Family Medicine

## 2018-01-10 NOTE — Telephone Encounter (Signed)
Any measured fevers?  If not, to okay to continue going to work and to finish her antibiotic treatment.  If she is not better at the end of the treatment, she can come in for reevaluation.  Virginia Crews, MD, MPH [facility] 01/10/2018 11:53 AM

## 2018-01-10 NOTE — Telephone Encounter (Signed)
Chills, hot flashes, achy and congested after being on antibiotics 3 days. 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  Needing to know what to do.  Please call pt back to discuss.  Thanks, [employer]

## 2018-01-10 NOTE — Telephone Encounter (Signed)
I called and spoke with patient. She states she has not measured her temperature.  Patient advised as below and verbally voiced understanding.

## 2018-01-12 ENCOUNTER — Other Ambulatory Visit: Payer: Self-pay | Admitting: Family Medicine

## 2018-02-08 ENCOUNTER — Ambulatory Visit (INDEPENDENT_AMBULATORY_CARE_PROVIDER_SITE_OTHER): Payer: BC Managed Care – PPO | Admitting: Family Medicine

## 2018-02-08 ENCOUNTER — Encounter: Payer: Self-pay | Admitting: Family Medicine

## 2018-02-08 VITALS — BP 126/84 | HR 99 | Temp 98.4°F | Wt 243.8 lb

## 2018-02-08 DIAGNOSIS — M545 Low back pain, unspecified: Secondary | ICD-10-CM

## 2018-02-08 MED ORDER — MELOXICAM 7.5 MG PO TABS: 7.5000 mg | ORAL_TABLET | Freq: Every day | ORAL | 0 refills | Status: DC

## 2018-02-08 NOTE — Progress Notes (Signed)
Patient: Tammy Washington Female    DOB: [birth year]   27 y.o.   MRN: [id number] Visit Date: 02/08/2018  Today's Provider: Lavon Paganini, MD   Chief Complaint  Patient presents with  . Back Pain   Subjective:    I, Tiburcio Pea, CMA, am acting as a scribe for Lavon Paganini, MD.   Back Pain  This is a new problem. Episode onset: Monday. The problem occurs intermittently. The problem has been gradually worsening since onset. The pain is present in the lumbar spine. The quality of the pain is described as shooting ("rubbing"). The pain does not radiate. The pain is at a severity of 7/10. The pain is the same all the time. Exacerbated by: certain movements. Pertinent negatives include no dysuria or pelvic pain. Treatments tried: Ibuprofen  The treatment provided no relief.    Monday, when pain was mild and had just started, she thought it was just PMS-related.  She was getting around fine until Wednesday.  She walked into work all right and then when she sat down she had intense pain in her mid lower back.  She does admit that she carries a very heavy bag with a laptop and other work supplies and then out of work every day.  She has to walk at least half a mile from her car to the building as she works at [employer].  She has never had back pain like this before.  She denies any radiation of the back pain.  States is better when she is laying flat.  She has also tried ice, heat, leg elevation, and ibuprofen.  No known injury or trauma.  Denies any numbness, weakness of extremities, incontinence, saddle anesthesia.  She states that it is a stabbing pain that is only present intermittently with certain movements.      No Known Allergies   Current Outpatient Medications:  .  citalopram (CELEXA) 20 MG tablet, TAKE 1 TABLET BY MOUTH EVERY DAY, Disp: 90 tablet, Rfl: 2 .  Norethindrone Acetate-Ethinyl Estradiol (JUNEL 1.5/30) 1.5-30 MG-MCG tablet, Take 1 tablet by mouth daily.,  Disp: 1 Package, Rfl: 11 .  SUMAtriptan (IMITREX) 50 MG tablet, Take 1 tablet (50 mg total) by mouth every 2 (two) hours as needed for migraine. May repeat in 2 hours if headache persists or recurs., Disp: 10 tablet, Rfl: 5  Review of Systems  Constitutional: Negative.   HENT: Negative.   Respiratory: Negative.   Cardiovascular: Negative.   Gastrointestinal: Negative.   Genitourinary: Negative for difficulty urinating, dysuria, flank pain, frequency and pelvic pain.  Musculoskeletal: Positive for back pain and myalgias. Negative for gait problem, joint swelling, neck pain and neck stiffness.  Skin: Negative.   Neurological: Negative.   Psychiatric/Behavioral: Negative.     Social History   Tobacco Use  . Smoking status: Former Smoker    Years: 3.00    Types: Cigarettes    Last attempt to quit: 04/24/2015    Years since quitting: 2.7  . Smokeless tobacco: Never Used  . Tobacco comment: former social smoker  Substance Use Topics  . Alcohol use: Yes    Alcohol/week: 1.0 - 2.0 standard drinks    Types: 1 - 2 Cans of beer per week   Objective:   BP 126/84 (BP Location: Right Arm, Patient Position: Sitting, Cuff Size: Large)   Pulse 99   Temp 98.4 F (36.9 C) (Oral)   Wt 243 lb 12.8 oz (110.6 kg)   SpO2 98%  BMI 33.07 kg/m  Vitals:   02/08/18 1458  BP: 126/84  Pulse: 99  Temp: 98.4 F (36.9 C)  TempSrc: Oral  SpO2: 98%  Weight: 243 lb 12.8 oz (110.6 kg)     Physical Exam  Constitutional: She is oriented to person, place, and time. She appears well-developed and well-nourished. No distress.  HENT:  Head: Normocephalic and atraumatic.  Eyes: Conjunctivae are normal. No scleral icterus.  Neck: Neck supple. No thyromegaly present.  Cardiovascular: Normal rate, regular rhythm, normal heart sounds and intact distal pulses.  No murmur heard. Pulmonary/Chest: Effort normal and breath sounds normal. No respiratory distress. She has no wheezes. She has no rales.    Abdominal: Soft. She exhibits no distension. There is no tenderness.  Musculoskeletal: She exhibits no edema.  Back: No midline tenderness to palpation over spinous processes.  Mild tenderness to palpation over right-sided paraspinal musculature.  Negative straight leg raise bilaterally.  Strength and sensation in bilateral lower extremities are intact and symmetric.  Range of motion is slightly limited in all directions.  This may be related to pain.  Lymphadenopathy:    She has no cervical adenopathy.  Neurological: She is alert and oriented to person, place, and time.  Skin: Skin is warm and dry. Capillary refill takes less than 2 seconds. No rash noted.  Psychiatric: She has a normal mood and affect. Her behavior is normal.  Vitals reviewed.       Assessment & Plan:   1. Acute bilateral low back pain without sciatica - new problem - no signs of radiculopathy - symptoms consistent with MSK strain - discussed ergonomic measures - elevating computer monitor, proper posture, avoiding carrying large bag - treat with mobic and flexeril prn - discussed HEP - could consider formal PT in the future if needed - discussed symptomatic relief, natural course, and return precautions   Meds ordered this encounter  Medications  . meloxicam (MOBIC) 7.5 MG tablet    Sig: Take 1 tablet (7.5 mg total) by mouth daily.    Dispense:  30 tablet    Refill:  0     Return if symptoms worsen or fail to improve.   The entirety of the information documented in the History of Present Illness, Review of Systems and Physical Exam were personally obtained by me. Portions of this information were initially documented by Tiburcio Pea, CMA and reviewed by me for thoroughness and accuracy.    Virginia Crews, MD, MPH [facility] 02/09/2018 10:21 AM

## 2018-02-08 NOTE — Patient Instructions (Signed)
Back Exercises The following exercises strengthen the muscles that help to support the back. They also help to keep the lower back flexible. Doing these exercises can help to prevent back pain or lessen existing pain. If you have back pain or discomfort, try doing these exercises 2-3 times each day or as told by your health care provider. When the pain goes away, do them once each day, but increase the number of times that you repeat the steps for each exercise (do more repetitions). If you do not have back pain or discomfort, do these exercises once each day or as told by your health care provider. Exercises Single Knee to Chest  Repeat these steps 3-5 times for each leg: 1. Lie on your back on a firm bed or the floor with your legs extended. 2. Bring one knee to your chest. Your other leg should stay extended and in contact with the floor. 3. Hold your knee in place by grabbing your knee or thigh. 4. Pull on your knee until you feel a gentle stretch in your lower back. 5. Hold the stretch for 10-30 seconds. 6. Slowly release and straighten your leg.  Pelvic Tilt  Repeat these steps 5-10 times: 1. Lie on your back on a firm bed or the floor with your legs extended. 2. Bend your knees so they are pointing toward the ceiling and your feet are flat on the floor. 3. Tighten your lower abdominal muscles to press your lower back against the floor. This motion will tilt your pelvis so your tailbone points up toward the ceiling instead of pointing to your feet or the floor. 4. With gentle tension and even breathing, hold this position for 5-10 seconds.  Cat-Cow  Repeat these steps until your lower back becomes more flexible: 1. Get into a hands-and-knees position on a firm surface. Keep your hands under your shoulders, and keep your knees under your hips. You may place padding under your knees for comfort. 2. Let your head hang down, and point your tailbone toward the floor so your lower back  becomes rounded like the back of a cat. 3. Hold this position for 5 seconds. 4. Slowly lift your head and point your tailbone up toward the ceiling so your back forms a sagging arch like the back of a cow. 5. Hold this position for 5 seconds.  Press-Ups  Repeat these steps 5-10 times: 1. Lie on your abdomen (face-down) on the floor. 2. Place your palms near your head, about shoulder-width apart. 3. While you keep your back as relaxed as possible and keep your hips on the floor, slowly straighten your arms to raise the top half of your body and lift your shoulders. Do not use your back muscles to raise your upper torso. You may adjust the placement of your hands to make yourself more comfortable. 4. Hold this position for 5 seconds while you keep your back relaxed. 5. Slowly return to lying flat on the floor.  Bridges  Repeat these steps 10 times: 1. Lie on your back on a firm surface. 2. Bend your knees so they are pointing toward the ceiling and your feet are flat on the floor. 3. Tighten your buttocks muscles and lift your buttocks off of the floor until your waist is at almost the same height as your knees. You should feel the muscles working in your buttocks and the back of your thighs. If you do not feel these muscles, slide your feet 1-2 inches farther away   from your buttocks. 4. Hold this position for 3-5 seconds. 5. Slowly lower your hips to the starting position, and allow your buttocks muscles to relax completely.  If this exercise is too easy, try doing it with your arms crossed over your chest. Abdominal Crunches  Repeat these steps 5-10 times: 1. Lie on your back on a firm bed or the floor with your legs extended. 2. Bend your knees so they are pointing toward the ceiling and your feet are flat on the floor. 3. Cross your arms over your chest. 4. Tip your chin slightly toward your chest without bending your neck. 5. Tighten your abdominal muscles and slowly raise your  trunk (torso) high enough to lift your shoulder blades a tiny bit off of the floor. Avoid raising your torso higher than that, because it can put too much stress on your low back and it does not help to strengthen your abdominal muscles. 6. Slowly return to your starting position.  Back Lifts Repeat these steps 5-10 times: 1. Lie on your abdomen (face-down) with your arms at your sides, and rest your forehead on the floor. 2. Tighten the muscles in your legs and your buttocks. 3. Slowly lift your chest off of the floor while you keep your hips pressed to the floor. Keep the back of your head in line with the curve in your back. Your eyes should be looking at the floor. 4. Hold this position for 3-5 seconds. 5. Slowly return to your starting position.  Contact a health care provider if:  Your back pain or discomfort gets much worse when you do an exercise.  Your back pain or discomfort does not lessen within 2 hours after you exercise. If you have any of these problems, stop doing these exercises right away. Do not do them again unless your health care provider says that you can. Get help right away if:  You develop sudden, severe back pain. If this happens, stop doing the exercises right away. Do not do them again unless your health care provider says that you can. This information is not intended to replace advice given to you by your health care provider. Make sure you discuss any questions you have with your health care provider. Document Released: 05/18/2004 Document Revised: 08/18/2015 Document Reviewed: 06/04/2014 Elsevier Interactive Patient Education  2017 [employer] Back Pain, Adult Many adults have back pain from time to time. Common causes of back pain include:  A strained muscle or ligament.  Wear and tear (degeneration) of the spinal disks.  Arthritis.  A hit to the back.  Back pain can be short-lived (acute) or last a long time (chronic). A physical exam, lab  tests, and imaging studies may be done to find the cause of your pain. Follow these instructions at home: Managing pain and stiffness  Take over-the-counter and prescription medicines only as told by your health care provider.  If directed, apply heat to the affected area as often as told by your health care provider. Use the heat source that your health care provider recommends, such as a moist heat pack or a heating pad. ? Place a towel between your skin and the heat source. ? Leave the heat on for 20-30 minutes. ? Remove the heat if your skin turns bright red. This is especially important if you are unable to feel pain, heat, or cold. You have a greater risk of getting burned.  If directed, apply ice to the injured area: ? Put ice in  a plastic bag. ? Place a towel between your skin and the bag. ? Leave the ice on for 20 minutes, 2-3 times a day for the first 2-3 days. Activity  Do not stay in bed. Resting more than 1-2 days can delay your recovery.  Take short walks on even surfaces as soon as you are able. Try to increase the length of time you walk each day.  Do not sit, drive, or stand in one place for more than 30 minutes at a time. Sitting or standing for long periods of time can put stress on your back.  Use proper lifting techniques. When you bend and lift, use positions that put less stress on your back: ? [place] your knees. ? Keep the load close to your body. ? Avoid twisting.  Exercise regularly as told by your health care provider. Exercising will help your back heal faster. This also helps prevent back injuries by keeping muscles strong and flexible.  Your health care provider may recommend that you see a physical therapist. This person can help you come up with a safe exercise program. Do any exercises as told by your physical therapist. Lifestyle  Maintain a healthy weight. Extra weight puts stress on your back and makes it difficult to have good posture.  Avoid  activities or situations that make you feel anxious or stressed. Learn ways to manage anxiety and stress. One way to manage stress is through exercise. Stress and anxiety increase muscle tension and can make back pain worse. General instructions  Sleep on a firm mattress in a comfortable position. Try lying on your side with your knees slightly bent. If you lie on your back, put a pillow under your knees.  Follow your treatment plan as told by your health care provider. This may include: ? Cognitive or behavioral therapy. ? Acupuncture or massage therapy. ? Meditation or yoga. Contact a health care provider if:  You have pain that is not relieved with rest or medicine.  You have increasing pain going down into your legs or buttocks.  Your pain does not improve in 2 weeks.  You have pain at night.  You lose weight.  You have a fever or chills. Get help right away if:  You develop new bowel or bladder control problems.  You have unusual weakness or numbness in your arms or legs.  You develop nausea or vomiting.  You develop abdominal pain.  You feel faint. Summary  Many adults have back pain from time to time. A physical exam, lab tests, and imaging studies may be done to find the cause of your pain.  Use proper lifting techniques. When you bend and lift, use positions that put less stress on your back.  Take over-the-counter and prescription medicines and apply heat or ice as directed by your health care provider. This information is not intended to replace advice given to you by your health care provider. Make sure you discuss any questions you have with your health care provider. Document Released: 04/10/2005 Document Revised: 05/15/2016 Document Reviewed: 05/15/2016 Elsevier Interactive Patient Education  [employer].

## 2018-03-11 ENCOUNTER — Encounter: Payer: Self-pay | Admitting: Family Medicine

## 2018-03-11 NOTE — Progress Notes (Deleted)
Patient: Tammy Washington, Female    DOB: 1990/07/10, 27 y.o.   MRN: [id number] Visit Date: 03/11/2018  Today's Provider: Lavon Paganini, MD   No chief complaint on file.  Subjective:    Annual physical exam Tammy Washington is a 27 y.o. female who presents today for health maintenance and complete physical. She feels {DESC; WELL/FAIRLY WELL/POORLY:18703}. She reports exercising ***. She reports she is sleeping {DESC; WELL/FAIRLY WELL/POORLY:18703}.  -----------------------------------------------------------------   Review of Systems  Constitutional: Negative.   HENT: Negative.   Eyes: Negative.   Respiratory: Negative.   Cardiovascular: Negative.   Gastrointestinal: Negative.   Endocrine: Negative.   Genitourinary: Negative.   Musculoskeletal: Negative.   Skin: Negative.   Allergic/Immunologic: Negative.   Neurological: Negative.   Hematological: Negative.   Psychiatric/Behavioral: Negative.     Social History      She  reports that she quit smoking about 2 years ago. Her smoking use included cigarettes. She quit after 3.00 years of use. She has never used smokeless tobacco. She reports that she drinks about 1.0 - 2.0 standard drinks of alcohol per week. She reports that she does not use drugs.       Social History   Socioeconomic History  . Marital status: Single    Spouse name: Not on file  . Number of children: 0  . Years of education: 16  . Highest education level: Bachelor's degree (e.g., BA, AB, BS)  Occupational History  . Occupation: Optometrist at [employer]  . Financial resource strain: Not on file  . Food insecurity:    Worry: Not on file    Inability: Not on file  . Transportation needs:    Medical: Not on file    Non-medical: Not on file  Tobacco Use  . Smoking status: Former Smoker    Years: 3.00    Types: Cigarettes    Last attempt to quit: 04/24/2015    Years since quitting: 2.8  .  Smokeless tobacco: Never Used  . Tobacco comment: former social smoker  Substance and Sexual Activity  . Alcohol use: Yes    Alcohol/week: 1.0 - 2.0 standard drinks    Types: 1 - 2 Cans of beer per week  . Drug use: Never  . Sexual activity: Not Currently    Partners: Male    Birth control/protection: Abstinence  Lifestyle  . Physical activity:    Days per week: Not on file    Minutes per session: Not on file  . Stress: Not on file  Relationships  . Social connections:    Talks on phone: Not on file    Gets together: Not on file    Attends religious service: Not on file    Active member of club or organization: Not on file    Attends meetings of clubs or organizations: Not on file    Relationship status: Not on file  Other Topics Concern  . Not on file  Social History Narrative  . Not on file    Past Medical History:  Diagnosis Date  . Allergy   . H/O colonoscopy   . Migraine headache      Patient Active Problem List   Diagnosis Date Noted  . Allergic rhinitis 11/19/2017  . 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 Migraines 11/19/2017  . Family history of polyps in the colon 11/19/2017  . Contraception 11/19/2017  . Anxiety and depression  11/19/2017    Past Surgical History:  Procedure Laterality Date  . COLONOSCOPY WITH PROPOFOL N/A 12/12/2017   Procedure: COLONOSCOPY WITH PROPOFOL;  Surgeon: Lin Landsman, MD;  Location: [place];  Service: Endoscopy;  Laterality: N/A;  . NO PAST SURGERIES      Family History        Family Status  Relation Name Status  . Mother  Alive  . Father  Alive  . Sister  Alive  . MGM  (Not Specified)  . Sister  Alive  . Neg Hx  (Not Specified)        Her family history includes Colon polyps (age of onset: 15) in her sister; Dementia in her maternal grandmother; Healthy in her mother and sister; Rheum arthritis in her father. There is no history of Colon cancer, Breast cancer, Ovarian cancer,  or Cervical cancer.      No Known Allergies   Current Outpatient Medications:  .  citalopram (CELEXA) 20 MG tablet, TAKE 1 TABLET BY MOUTH EVERY DAY, Disp: 90 tablet, Rfl: 2 .  meloxicam (MOBIC) 7.5 MG tablet, Take 1 tablet (7.5 mg total) by mouth daily., Disp: 30 tablet, Rfl: 0 .  Norethindrone Acetate-Ethinyl Estradiol (JUNEL 1.5/30) 1.5-30 MG-MCG tablet, Take 1 tablet by mouth daily., Disp: 1 Package, Rfl: 11 .  SUMAtriptan (IMITREX) 50 MG tablet, Take 1 tablet (50 mg total) by mouth every 2 (two) hours as needed for migraine. May repeat in 2 hours if headache persists or recurs., Disp: 10 tablet, Rfl: 5   Patient Care Team: Virginia Crews, MD as PCP - General (Family Medicine)      Objective:   Vitals: There were no vitals taken for this visit.  There were no vitals filed for this visit.   Physical Exam   Depression Screen PHQ 2/9 Scores 11/19/2017  PHQ - 2 Score 3  PHQ- 9 Score 10      Assessment & Plan:     Routine Health Maintenance and Physical Exam  Exercise Activities and Dietary recommendations Goals   None      There is no immunization history on file for this patient.  Health Maintenance  Topic Date Due  . HIV Screening  09/17/2007  . TETANUS/TDAP  09/17/2011  . PAP SMEAR  09/16/2013  . INFLUENZA VACCINE  08/22/2018 (Originally 11/22/2017)     Discussed health benefits of physical activity, and encouraged her to engage in regular exercise appropriate for her age and condition.    --------------------------------------------------------------------    Lavon Paganini, MD  [place] [facility]

## 2018-09-27 ENCOUNTER — Encounter: Payer: Self-pay | Admitting: Family Medicine

## 2018-09-27 ENCOUNTER — Ambulatory Visit (INDEPENDENT_AMBULATORY_CARE_PROVIDER_SITE_OTHER): Payer: BC Managed Care – PPO | Admitting: Family Medicine

## 2018-09-27 ENCOUNTER — Other Ambulatory Visit: Payer: Self-pay

## 2018-09-27 VITALS — BP 123/85 | HR 96 | Temp 98.9°F | Resp 16 | Ht 72.0 in | Wt 252.0 lb

## 2018-09-27 DIAGNOSIS — F32A Depression, unspecified: Secondary | ICD-10-CM

## 2018-09-27 DIAGNOSIS — F329 Major depressive disorder, single episode, unspecified: Secondary | ICD-10-CM | POA: Diagnosis not present

## 2018-09-27 DIAGNOSIS — F419 Anxiety disorder, unspecified: Secondary | ICD-10-CM

## 2018-09-27 DIAGNOSIS — R221 Localized swelling, mass and lump, neck: Secondary | ICD-10-CM

## 2018-09-27 NOTE — Progress Notes (Signed)
Patient: Tammy Washington Female    DOB: 1990/12/22   28 y.o.   MRN: [id number] Visit Date: 09/27/2018  Today's Provider: Vernie Murders, PA   Chief Complaint  Patient presents with  . cyst on neck   Subjective:    HPI Patient comes in today c/o cyst (she describes it as fatty tissue) on the right side of her neck. She reports that she first noticed it yesterday. She denies any tenderness or pain. She denies fevers or URI symptoms. She denies having any difficulty swallowing.  She does mention that she has a strong family history of hypothyroidism, and feels that it could possibly be a goiter. She has not had her thyroid levels checked before.   Past Medical History:  Diagnosis Date  . Allergy   . H/O colonoscopy   . Migraine headache    Past Surgical History:  Procedure Laterality Date  . COLONOSCOPY WITH PROPOFOL N/A 12/12/2017   Procedure: COLONOSCOPY WITH PROPOFOL;  Surgeon: Lin Landsman, MD;  Location: [place];  Service: Endoscopy;  Laterality: N/A;  . NO PAST SURGERIES     Family History  Problem Relation Age of Onset  . Healthy Mother   . Rheum arthritis Father   . Colon polyps Sister 44       serrated adenoma  . Dementia Maternal Grandmother   . Healthy Sister   . Colon cancer Neg Hx   . Breast cancer Neg Hx   . Ovarian cancer Neg Hx   . Cervical cancer Neg Hx   ' No Known Allergies  Current Outpatient Medications:  .  citalopram (CELEXA) 20 MG tablet, TAKE 1 TABLET BY MOUTH EVERY DAY, Disp: 90 tablet, Rfl: 2 .  meloxicam (MOBIC) 7.5 MG tablet, Take 1 tablet (7.5 mg total) by mouth daily., Disp: 30 tablet, Rfl: 0 .  Norethindrone Acetate-Ethinyl Estradiol (JUNEL 1.5/30) 1.5-30 MG-MCG tablet, Take 1 tablet by mouth daily., Disp: 1 Package, Rfl: 11 .  SUMAtriptan (IMITREX) 50 MG tablet, Take 1 tablet (50 mg total) by mouth every 2 (two) hours as needed for migraine. May repeat in 2 hours if headache persists or recurs., Disp: 10 tablet,  Rfl: 5  Review of Systems  Constitutional: Negative for activity change, appetite change, chills, diaphoresis, fatigue, fever and unexpected weight change.  HENT: Negative for congestion, postnasal drip, rhinorrhea, sinus pressure, sinus pain, sneezing, sore throat, tinnitus, trouble swallowing and voice change.   Respiratory: Negative for cough, shortness of breath and wheezing.   Musculoskeletal: Negative for arthralgias, myalgias, neck pain and neck stiffness.   Social History   Tobacco Use  . Smoking status: Former Smoker    Years: 3.00    Types: Cigarettes    Last attempt to quit: 04/24/2015    Years since quitting: 3.4  . Smokeless tobacco: Never Used  . Tobacco comment: former social smoker  Substance Use Topics  . Alcohol use: Yes    Alcohol/week: 1.0 - 2.0 standard drinks    Types: 1 - 2 Cans of beer per week     Objective:   BP 123/85 (BP Location: Right Arm, Patient Position: Sitting, Cuff Size: Large)   Pulse 96   Temp 98.9 F (37.2 C)   Resp 16   Ht 6' (1.829 m)   Wt 252 lb (114.3 kg)   BMI 34.18 kg/m  Vitals:   09/27/18 1328  BP: 123/85  Pulse: 96  Resp: 16  Temp: 98.9 F (37.2 C)  Weight: 252 lb (114.3 kg)  Height: 6' (1.829 m)   Physical Exam Vitals signs and nursing note reviewed.  Constitutional:      General: She is not in acute distress.    Appearance: She is well-developed.  HENT:     Head: Normocephalic and atraumatic.     Right Ear: Hearing and tympanic membrane normal.     Left Ear: Hearing and tympanic membrane normal.     Nose: Nose normal.     Mouth/Throat:     Pharynx: Oropharynx is clear.     Comments: Large tonsils bilaterally. Eyes:     General: Lids are normal. No scleral icterus.       Right eye: No discharge.        Left eye: No discharge.     Conjunctiva/sclera: Conjunctivae normal.  Neck:     Musculoskeletal: Normal range of motion and neck supple.     Comments: Thickness versus thyroid lobe enlargement on the right.  No nodules, lymphadenopathy or cystic lesions palpable. Cardiovascular:     Rate and Rhythm: Normal rate and regular rhythm.     Heart sounds: Normal heart sounds.  Pulmonary:     Effort: Pulmonary effort is normal. No respiratory distress.     Breath sounds: Normal breath sounds.  Abdominal:     General: Bowel sounds are normal.     Palpations: Abdomen is soft.  Musculoskeletal: Normal range of motion.  Lymphadenopathy:     Cervical: No cervical adenopathy.  Skin:    Findings: No lesion or rash.  Neurological:     Mental Status: She is alert and oriented to person, place, and time.  Psychiatric:        Speech: Speech normal.        Behavior: Behavior normal.        Thought Content: Thought content normal.       Assessment & Plan    1. Mass of right side of neck Noticed a thick soft mass in the right side of neck yesterday. No pain or tenderness. BMI over 34. Using BCP irregularly. Last period started a week ago. No tremor, exophthalmos, hair loss or constipation. Family history positive for thyroid disease in father, sister and PGM. Will check labs and follow up pending reports. Recommended she use the BCP daily for at least 3 months regularly to regulate cycles. - CBC with Differential/Platelet - Comprehensive metabolic panel - TSH - T4  2. Anxiety and depression Feeling stable on the Celexa 20 mg qd. No suicidal ideation, panic attacks or sleep disturbance. Continue present dosage.     Vernie Murders, PA  [facility]

## 2019-04-04 ENCOUNTER — Other Ambulatory Visit: Payer: Self-pay

## 2019-04-04 DIAGNOSIS — Z20822 Contact with and (suspected) exposure to covid-19: Secondary | ICD-10-CM

## 2019-04-07 LAB — NOVEL CORONAVIRUS, NAA: SARS-CoV-2, NAA: NOT DETECTED

## 2019-06-17 ENCOUNTER — Encounter: Payer: Self-pay | Admitting: Family Medicine

## 2019-06-17 ENCOUNTER — Ambulatory Visit (INDEPENDENT_AMBULATORY_CARE_PROVIDER_SITE_OTHER): Payer: BC Managed Care – PPO | Admitting: Family Medicine

## 2019-06-17 DIAGNOSIS — R197 Diarrhea, unspecified: Secondary | ICD-10-CM

## 2019-06-17 DIAGNOSIS — F411 Generalized anxiety disorder: Secondary | ICD-10-CM | POA: Diagnosis not present

## 2019-06-17 DIAGNOSIS — F331 Major depressive disorder, recurrent, moderate: Secondary | ICD-10-CM | POA: Diagnosis not present

## 2019-06-17 MED ORDER — CITALOPRAM HYDROBROMIDE 40 MG PO TABS: 40.0000 mg | ORAL_TABLET | Freq: Every day | ORAL | 2 refills | Status: DC

## 2019-06-17 MED ORDER — HYDROXYZINE HCL 10 MG PO TABS: 10.0000 mg | ORAL_TABLET | Freq: Three times a day (TID) | ORAL | 0 refills | Status: DC | PRN

## 2019-06-17 NOTE — Assessment & Plan Note (Signed)
Recurrent problem She previously took Celexa with out any side effects and with good control of her anxiety Due to the increased stress with her job, she has had an exacerbation recently Discussed synergistic effects of medications and therapy Resume Celexa at 20 mg daily for 1 week and then increase to 40 mg daily Discussed use of hydroxyzine as needed for episodic acute anxiety Discussed possible side effects Discussed return precautions Follow-up in 2 months and repeat PHQ-9 and GAD-7

## 2019-06-17 NOTE — Progress Notes (Signed)
Patient: Tammy Washington Female    DOB: 08-18-1990   29 y.o.   MRN: [id number] Visit Date: 06/17/2019  Today's Provider: Lavon Paganini, MD   Chief Complaint  Patient presents with  . Diarrhea   Subjective:    I, Porsha McClurkin CMA, am acting as a scribe for Lavon Paganini, MD.   Virtual Visit via Telephone Note  I connected with Tammy Washington on 06/17/19 at 10:20 AM EST by telephone and verified that I am speaking with the correct person using two identifiers.  Location: Patient location: home Provider location: home office Persons involved in the visit: patient, provider   I discussed the limitations, risks, security and privacy concerns of performing an evaluation and management service by telephone and the availability of in person appointments. I also discussed with the patient that there may be a patient responsible charge related to this service. The patient expressed understanding and agreed to proceed.  Diarrhea  This is a recurrent problem. The current episode started more than 1 month ago. The problem has been unchanged. The stool consistency is described as watery. The patient states that diarrhea awakens her from sleep. Associated symptoms include abdominal pain, bloating, sweats and vomiting. There are no known risk factors. She has tried nothing for the symptoms. The treatment provided no relief.   Worse with stress or upcoming travel Ongoing for 3 months intermittently N/V/D, diaphoresis, cramping for 3-4 hrs each episode Was a problem in high school and she was told that it wasn't IBS Working on low FOD-MAP diet  No blood in stool  Stopped Celexa 8 months ago and she was feeling well.  Feels like anxiety is high over the last several months.  Work has changed significantly since that time.  Thinks that she will need something a little stronger.  Depression screen [facility] 2/9 06/17/2019 11/19/2017  Decreased Interest 1 2  Down, Depressed, Hopeless 2  1  PHQ - 2 Score 3 3  Altered sleeping 3 2  Tired, decreased energy 3 2  Change in appetite 1 1  Feeling bad or failure about yourself  1 2  Trouble concentrating 3 0  Moving slowly or fidgety/restless 0 0  Suicidal thoughts 0 0  PHQ-9 Score 14 10  Difficult doing work/chores Very difficult Somewhat difficult   GAD 7 : Generalized Anxiety Score 06/17/2019 11/19/2017  Nervous, Anxious, on Edge 3 2  Control/stop worrying 3 2  Worry too much - different things 3 2  Trouble relaxing 3 2  Restless 2 2  Easily annoyed or irritable 3 3  Afraid - awful might happen 2 2  Total GAD 7 Score 19 15  Anxiety Difficulty Very difficult Very difficult      No Known Allergies   Current Outpatient Medications:  .  citalopram (CELEXA) 20 MG tablet, TAKE 1 TABLET BY MOUTH EVERY DAY, Disp: 90 tablet, Rfl: 2 .  meloxicam (MOBIC) 7.5 MG tablet, Take 1 tablet (7.5 mg total) by mouth daily., Disp: 30 tablet, Rfl: 0 .  SUMAtriptan (IMITREX) 50 MG tablet, Take 1 tablet (50 mg total) by mouth every 2 (two) hours as needed for migraine. May repeat in 2 hours if headache persists or recurs., Disp: 10 tablet, Rfl: 5 .  Norethindrone Acetate-Ethinyl Estradiol (JUNEL 1.5/30) 1.5-30 MG-MCG tablet, Take 1 tablet by mouth daily. (Patient not taking: Reported on 06/17/2019), Disp: 1 Package, Rfl: 11  Review of Systems  Gastrointestinal: Positive for abdominal pain, bloating, diarrhea and vomiting.  Social History   Tobacco Use  . Smoking status: Former Smoker    Years: 3.00    Types: Cigarettes    Quit date: 04/24/2015    Years since quitting: 4.1  . Smokeless tobacco: Never Used  . Tobacco comment: former social smoker  Substance Use Topics  . Alcohol use: Yes    Alcohol/week: 1.0 - 2.0 standard drinks    Types: 1 - 2 Cans of beer per week      Objective:   There were no vitals taken for this visit. There were no vitals filed for this visit.There is no height or weight on file to calculate  BMI.   Physical Exam Speaking in full sentences with no apparent distress  No results found for any visits on 06/17/19.     Assessment & Plan    I discussed the assessment and treatment plan with the patient. The patient was provided an opportunity to ask questions and all were answered. The patient agreed with the plan and demonstrated an understanding of the instructions.   The patient was advised to call back or seek an in-person evaluation if the symptoms worsen or if the condition fails to improve as anticipated.  Problem List Items Addressed This Visit      Other   Diarrhea - Primary    New problem x3 months that is episodic in nature She had a colonoscopy last year for family history of polyps that was completely benign She did not have any biopsies taken at that time to look for IBD or celiac as she was not having this issue at that time Discussed starting with lab work including CMP, CBC, TSH, celiac panel We will also check ESR to look for any signs of inflammation As her symptoms seem to coincide with her anxiety and stress levels, it is likely that she has IBS, but we did discuss this is a diagnosis of exclusion Continue low FODMAP diet Pending normal labs, consider starting Imodium or other antidiarrheal      Relevant Orders   Comprehensive metabolic panel   CBC w/Diff/Platelet   Glia (IgA/G) + tTG IgA   Sed Rate (ESR)   TSH   GAD (generalized anxiety disorder)    Recurrent problem She previously took Celexa with out any side effects and with good control of her anxiety Due to the increased stress with her job, she has had an exacerbation recently Discussed synergistic effects of medications and therapy Resume Celexa at 20 mg daily for 1 week and then increase to 40 mg daily Discussed use of hydroxyzine as needed for episodic acute anxiety Discussed possible side effects Discussed return precautions Follow-up in 2 months and repeat PHQ-9 and GAD-7       Relevant Medications   hydrOXYzine (ATARAX/VISTARIL) 10 MG tablet   citalopram (CELEXA) 40 MG tablet   Moderate episode of recurrent major depressive disorder ([place])    Recurrent problem with recent worsening due to stress at work Encouraged therapy and discussed the synergistic effects with medication As above, resume Celexa 20 mg daily for 1 week and then increase to 40 mg daily Follow-up in 2 months and repeat GAD-7 and PHQ-9      Relevant Medications   hydrOXYzine (ATARAX/VISTARIL) 10 MG tablet   citalopram (CELEXA) 40 MG tablet       Return in about 2 months (around 08/15/2019) for MDD/GAD f/u.   The entirety of the information documented in the History of Present Illness, Review of Systems and Physical  Exam were personally obtained by me. Portions of this information were initially documented by [facility], CMA and reviewed by me for thoroughness and accuracy.    Marena Witts, Dionne Bucy, MD MPH [facility]

## 2019-06-17 NOTE — Assessment & Plan Note (Signed)
New problem x3 months that is episodic in nature She had a colonoscopy last year for family history of polyps that was completely benign She did not have any biopsies taken at that time to look for IBD or celiac as she was not having this issue at that time Discussed starting with lab work including CMP, CBC, TSH, celiac panel We will also check ESR to look for any signs of inflammation As her symptoms seem to coincide with her anxiety and stress levels, it is likely that she has IBS, but we did discuss this is a diagnosis of exclusion Continue low FODMAP diet Pending normal labs, consider starting Imodium or other antidiarrheal

## 2019-06-17 NOTE — Assessment & Plan Note (Signed)
Recurrent problem with recent worsening due to stress at work Encouraged therapy and discussed the synergistic effects with medication As above, resume Celexa 20 mg daily for 1 week and then increase to 40 mg daily Follow-up in 2 months and repeat GAD-7 and PHQ-9

## 2019-06-19 LAB — COMPREHENSIVE METABOLIC PANEL
ALT: 18 IU/L (ref 0–32)
AST: 20 IU/L (ref 0–40)
Albumin/Globulin Ratio: 1.3 (ref 1.2–2.2)
Albumin: 4.4 g/dL (ref 3.9–5.0)
Alkaline Phosphatase: 74 IU/L (ref 39–117)
BUN/Creatinine Ratio: 12 (ref 9–23)
BUN: 8 mg/dL (ref 6–20)
Bilirubin Total: 0.3 mg/dL (ref 0.0–1.2)
CO2: 23 mmol/L (ref 20–29)
Calcium: 9.6 mg/dL (ref 8.7–10.2)
Chloride: 100 mmol/L (ref 96–106)
Creatinine, Ser: 0.68 mg/dL (ref 0.57–1.00)
GFR calc Af Amer: 140 mL/min/{1.73_m2} (ref >59)
GFR calc non Af Amer: 121 mL/min/{1.73_m2} (ref >59)
Globulin, Total: 3.3 g/dL (ref 1.5–4.5)
Glucose: 83 mg/dL (ref 65–99)
Potassium: 4.2 mmol/L (ref 3.5–5.2)
Sodium: 138 mmol/L (ref 134–144)
Total Protein: 7.7 g/dL (ref 6.0–8.5)

## 2019-06-19 LAB — GLIA (IGA/G) + TTG IGA
Antigliadin Abs, IgA: 5 units (ref 0–19)
Gliadin IgG: 2 units (ref 0–19)
Transglutaminase IgA: <2 U/mL (ref 0–3)

## 2019-06-19 LAB — CBC WITH DIFFERENTIAL/PLATELET
Basophils Absolute: 0.1 10*3/uL (ref 0.0–0.2)
Basos: 2 %
EOS (ABSOLUTE): 0.1 10*3/uL (ref 0.0–0.4)
Eos: 1 %
Hematocrit: 40.3 % (ref 34.0–46.6)
Hemoglobin: 13.5 g/dL (ref 11.1–15.9)
Immature Grans (Abs): 0 10*3/uL (ref 0.0–0.1)
Immature Granulocytes: 0 %
Lymphocytes Absolute: 1.4 10*3/uL (ref 0.7–3.1)
Lymphs: 27 %
MCH: 30 pg (ref 26.6–33.0)
MCHC: 33.5 g/dL (ref 31.5–35.7)
MCV: 90 fL (ref 79–97)
Monocytes Absolute: 0.5 10*3/uL (ref 0.1–0.9)
Monocytes: 10 %
Neutrophils Absolute: 3.2 10*3/uL (ref 1.4–7.0)
Neutrophils: 60 %
Platelets: 326 10*3/uL (ref 150–450)
RBC: 4.5 x10E6/uL (ref 3.77–5.28)
RDW: 12.6 % (ref 11.7–15.4)
WBC: 5.3 10*3/uL (ref 3.4–10.8)

## 2019-06-19 LAB — SEDIMENTATION RATE: Sed Rate: 26 mm/hr (ref 0–32)

## 2019-06-19 LAB — TSH: TSH: 2.41 u[IU]/mL (ref 0.450–4.500)

## 2019-06-20 NOTE — Progress Notes (Signed)
CMP shows electrolytes, kidney and liver function within normal range. No elevated white blood cells, no anemia. Celiac panel is negative. Sedimentation rate for inflammation is negative. If symptoms of diarrhea are persisting please inform provider.

## 2019-07-06 ENCOUNTER — Other Ambulatory Visit: Payer: Self-pay

## 2019-07-06 ENCOUNTER — Encounter: Payer: Self-pay | Admitting: *Deleted

## 2019-07-06 ENCOUNTER — Emergency Department
Admission: EM | Admit: 2019-07-06 | Discharge: 2019-07-06 | Disposition: A | Discharge status: Home / Self Care | Payer: BC Managed Care – PPO | Attending: Emergency Medicine | Admitting: Emergency Medicine

## 2019-07-06 ENCOUNTER — Emergency Department: Payer: BC Managed Care – PPO

## 2019-07-06 DIAGNOSIS — R0789 Other chest pain: Secondary | ICD-10-CM | POA: Insufficient documentation

## 2019-07-06 DIAGNOSIS — Z5321 Procedure and treatment not carried out due to patient leaving prior to being seen by health care provider: Secondary | ICD-10-CM | POA: Insufficient documentation

## 2019-07-06 LAB — URINALYSIS, COMPLETE (UACMP) WITH MICROSCOPIC
Bilirubin Urine: NEGATIVE
Glucose, UA: NEGATIVE mg/dL
Ketones, ur: NEGATIVE mg/dL
Leukocytes,Ua: NEGATIVE
Nitrite: NEGATIVE
Protein, ur: 30 mg/dL — AB
RBC / HPF: >50 RBC/hpf — ABNORMAL HIGH (ref 0–5)
Specific Gravity, Urine: 1.025 (ref 1.005–1.030)
pH: 5 (ref 5.0–8.0)

## 2019-07-06 LAB — CBC
HCT: 38.5 % (ref 36.0–46.0)
Hemoglobin: 13 g/dL (ref 12.0–15.0)
MCH: 29.5 pg (ref 26.0–34.0)
MCHC: 33.8 g/dL (ref 30.0–36.0)
MCV: 87.5 fL (ref 80.0–100.0)
Platelets: 287 10*3/uL (ref 150–400)
RBC: 4.4 MIL/uL (ref 3.87–5.11)
RDW: 12.9 % (ref 11.5–15.5)
WBC: 6.5 10*3/uL (ref 4.0–10.5)
nRBC: 0 % (ref 0.0–0.2)

## 2019-07-06 LAB — COMPREHENSIVE METABOLIC PANEL
ALT: 23 U/L (ref 0–44)
AST: 27 U/L (ref 15–41)
Albumin: 4 g/dL (ref 3.5–5.0)
Alkaline Phosphatase: 60 U/L (ref 38–126)
Anion gap: 10 (ref 5–15)
BUN: 9 mg/dL (ref 6–20)
CO2: 21 mmol/L — ABNORMAL LOW (ref 22–32)
Calcium: 8.6 mg/dL — ABNORMAL LOW (ref 8.9–10.3)
Chloride: 106 mmol/L (ref 98–111)
Creatinine, Ser: 0.63 mg/dL (ref 0.44–1.00)
GFR calc Af Amer: >60 mL/min (ref >60)
GFR calc non Af Amer: >60 mL/min (ref >60)
Glucose, Bld: 126 mg/dL — ABNORMAL HIGH (ref 70–99)
Potassium: 3.1 mmol/L — ABNORMAL LOW (ref 3.5–5.1)
Sodium: 137 mmol/L (ref 135–145)
Total Bilirubin: 0.2 mg/dL — ABNORMAL LOW (ref 0.3–1.2)
Total Protein: 7.4 g/dL (ref 6.5–8.1)

## 2019-07-06 LAB — LIPASE, BLOOD: Lipase: 30 U/L (ref 11–51)

## 2019-07-06 LAB — POCT PREGNANCY, URINE: Preg Test, Ur: NEGATIVE

## 2019-07-06 LAB — TROPONIN I (HIGH SENSITIVITY): Troponin I (High Sensitivity): <2 ng/L (ref <18)

## 2019-07-06 MED ORDER — SODIUM CHLORIDE 0.9% FLUSH: 3.0000 mL | Freq: Once | INTRAVENOUS | Status: DC

## 2019-07-06 NOTE — ED Notes (Signed)
Pt decided to leave at his time. Pt reports that "I know that this is a panic attack". Pt encouraged to stay or come back if symptoms become worse. Pt verbalized understanding and left ED ambulatory with steady gait.

## 2019-07-06 NOTE — ED Triage Notes (Signed)
Pt c/o Right upper chest pain reproducible w/ inspiration x 2.5 hrs. Pt states she was watching tv in bed when first onset of pain. Pt has hx of anxiety and takes meds for this. Pt states she also experienced tingling in fingers, toes and circumorally and endorses hyperventilation. Pt states she has also experienced nausea, diarrhea and dry-heaves. Pt in no acute distress at this time.

## 2019-07-09 ENCOUNTER — Other Ambulatory Visit: Payer: Self-pay | Admitting: Family Medicine

## 2019-08-15 ENCOUNTER — Telehealth: Payer: Self-pay

## 2019-08-15 ENCOUNTER — Telehealth (INDEPENDENT_AMBULATORY_CARE_PROVIDER_SITE_OTHER): Payer: BC Managed Care – PPO | Admitting: Family Medicine

## 2019-08-15 ENCOUNTER — Encounter: Payer: Self-pay | Admitting: Family Medicine

## 2019-08-15 VITALS — Temp 97.4°F | Ht 72.0 in | Wt 247.0 lb

## 2019-08-15 DIAGNOSIS — F411 Generalized anxiety disorder: Secondary | ICD-10-CM | POA: Diagnosis not present

## 2019-08-15 DIAGNOSIS — F331 Major depressive disorder, recurrent, moderate: Secondary | ICD-10-CM

## 2019-08-15 MED ORDER — SERTRALINE HCL 100 MG PO TABS: 100.0000 mg | ORAL_TABLET | Freq: Every day | ORAL | 3 refills | Status: DC

## 2019-08-15 NOTE — Telephone Encounter (Signed)
Left message for patient to call back to schedule 2 month follow up. PEC please schedule when patient calls back. Appointment can be in person or virtual.

## 2019-08-15 NOTE — Assessment & Plan Note (Signed)
Chronic and uncontrolled Recent panic attack requiring ED visit She had previously taken Celexa without any side effects and with good control of her anxiety, but she is not seeing any improvement at this time, so we will discontinue Encouraged therapy and mindfulness meditation Discussed synergistic effects with medications Start Zoloft 100 mg daily in lieu of Celexa Discussed possible side effects Discussed use of hydroxyzine as needed for episodic acute anxiety Discussed return precautions Follow-up in 2 months and repeat PHQ-9 and GAD-7

## 2019-08-15 NOTE — Progress Notes (Signed)
Established patient visit   Patient: Tammy Washington   DOB: [birth year]   29 y.o. Female  MRN: [id number] Visit Date: 08/15/2019  I,Sulibeya S Dimas,acting as a scribe for Lavon Paganini, MD.,have documented all relevant documentation on the behalf of Lavon Paganini, MD,as directed by  Lavon Paganini, MD while in the presence of Lavon Paganini, MD.  Today's healthcare provider: Lavon Paganini, MD   Chief Complaint  Patient presents with  . Anxiety  . Depression   Subjective    Patient went to [facility] ER on 07/06/2019, due to chest pain. Patient left without being seen. Patient reports that she had a panic attack.   Anxiety Presents for follow-up visit. Symptoms include chest pain, decreased concentration, depressed mood, dizziness, excessive worry, hyperventilation, insomnia, irritability, muscle tension, nausea, nervous/anxious behavior, panic, restlessness and shortness of breath. Patient reports no malaise, palpitations or suicidal ideas. Symptoms occur occasionally. The severity of symptoms is mild. The patient sleeps 6 hours per night. The quality of sleep is fair. Nighttime awakenings: several.   Her past medical history is significant for depression. Compliance with medications is 76-100%. Side effects of treatment include limb pain.  Depression      The patient presents with depression.  Chronicity: follow up from 2 months, Citalopram was increased to 40 mg daily.  The onset quality is gradual.   The problem occurs every several days.The problem is unchanged (with citalopram 40 mg).  Associated symptoms include decreased concentration, hopelessness, insomnia, irritable, restlessness, decreased interest and appetite change.  Associated symptoms include no fatigue and no suicidal ideas.     The symptoms are aggravated by work stress.  Past treatments include SSRIs - Selective serotonin reuptake inhibitors.  Compliance with treatment is good.  Previous treatment provided  no relief relief.  Past medical history includes anxiety and depression.     Has not seen a difference in anxiety or depression Was seen in ED for panic attack Trying online therapy  GAD 7 : Generalized Anxiety Score 08/15/2019 06/17/2019 11/19/2017  Nervous, Anxious, on Edge 2 3 2   Control/stop worrying 2 3 2   Worry too much - different things 2 3 2   Trouble relaxing 2 3 2   Restless 2 2 2   Easily annoyed or irritable 2 3 3   Afraid - awful might happen 0 2 2  Total GAD 7 Score 12 19 15   Anxiety Difficulty Very difficult Very difficult Very difficult    Depression screen [facility] 2/9 08/15/2019 06/17/2019 11/19/2017  Decreased Interest 2 1 2   Down, Depressed, Hopeless 2 2 1   PHQ - 2 Score 4 3 3   Altered sleeping 2 3 2   Tired, decreased energy 1 3 2   Change in appetite 1 1 1   Feeling bad or failure about yourself  1 1 2   Trouble concentrating 3 3 0  Moving slowly or fidgety/restless 1 0 0  Suicidal thoughts 0 0 0  PHQ-9 Score 13 14 10   Difficult doing work/chores Very difficult Very difficult Somewhat difficult     Social History   Tobacco Use  . Smoking status: Former Smoker    Years: 3.00    Types: Cigarettes    Quit date: 04/24/2015    Years since quitting: 4.3  . Smokeless tobacco: Never Used  . Tobacco comment: former social smoker  Substance Use Topics  . Alcohol use: Yes    Alcohol/week: 1.0 - 2.0 standard drinks    Types: 1 - 2 Cans of beer per week  . Drug  use: Never       Medications: Outpatient Medications Prior to Visit  Medication Sig  . hydrOXYzine (ATARAX/VISTARIL) 10 MG tablet Take 1 tablet (10 mg total) by mouth 3 (three) times daily as needed.  . SUMAtriptan (IMITREX) 50 MG tablet Take 1 tablet (50 mg total) by mouth every 2 (two) hours as needed for migraine. May repeat in 2 hours if headache persists or recurs.  . [DISCONTINUED] citalopram (CELEXA) 40 MG tablet Take 1 tablet (40 mg total) by mouth daily.  . [DISCONTINUED] meloxicam (MOBIC) 7.5 MG  tablet Take 1 tablet (7.5 mg total) by mouth daily.   No facility-administered medications prior to visit.    Review of Systems  Constitutional: Positive for appetite change and irritability. Negative for fatigue and unexpected weight change.  Respiratory: Positive for shortness of breath. Negative for chest tightness.   Cardiovascular: Positive for chest pain. Negative for palpitations.  Gastrointestinal: Positive for nausea. Negative for abdominal pain, diarrhea and vomiting.  Neurological: Positive for dizziness.  Psychiatric/Behavioral: Positive for decreased concentration and depression. Negative for suicidal ideas. The patient is nervous/anxious and has insomnia.     Objective    Temp (!) 97.4 F (36.3 C) (Oral)   Ht 6' (1.829 m)   Wt 247 lb (112 kg)   LMP 08/01/2019 (Approximate)   BMI 33.50 kg/m  Wt Readings from Last 3 Encounters:  08/15/19 247 lb (112 kg)  07/06/19 249 lb (112.9 kg)  09/27/18 252 lb (114.3 kg)      Physical Exam Constitutional:      General: She is irritable. She is not in acute distress.    Appearance: Normal appearance.  Pulmonary:     Effort: Pulmonary effort is normal. No respiratory distress.  Neurological:     Mental Status: She is alert and oriented to person, place, and time. Mental status is at baseline.  Psychiatric:        Mood and Affect: Mood and affect normal.        Speech: Speech normal.        Behavior: Behavior normal.        Thought Content: Thought content does not include homicidal or suicidal ideation.      No results found for any visits on 08/15/19.  Assessment & Plan     Problem List Items Addressed This Visit      Other   GAD (generalized anxiety disorder)    Chronic and uncontrolled Recent panic attack requiring ED visit She had previously taken Celexa without any side effects and with good control of her anxiety, but she is not seeing any improvement at this time, so we will discontinue Encouraged therapy  and mindfulness meditation Discussed synergistic effects with medications Start Zoloft 100 mg daily in lieu of Celexa Discussed possible side effects Discussed use of hydroxyzine as needed for episodic acute anxiety Discussed return precautions Follow-up in 2 months and repeat PHQ-9 and GAD-7      Relevant Medications   sertraline (ZOLOFT) 100 MG tablet   Moderate episode of recurrent major depressive disorder ([facility]) - Primary    Chronic/recurrent and uncontrolled Earlier this year, she had worsening due to stress at work Continue therapy Discussed synergistic effects of medication Also encouraged mindfulness and meditation No improvement from Celexa, so we will discontinue Start Zoloft 100 mg daily Discussed possible side effects Follow-up in 2 months and repeat PHQ-9 and GAD-7      Relevant Medications   sertraline (ZOLOFT) 100 MG tablet  Return in about 2 months (around 10/15/2019) for MDD/GAD f/u.      I, Lavon Paganini, MD, have reviewed all documentation for this visit. The documentation on 08/15/19 for the exam, diagnosis, procedures, and orders are all accurate and complete.   Scottlynn Lindell, Dionne Bucy, MD, MPH [facility]

## 2019-08-15 NOTE — Assessment & Plan Note (Signed)
Chronic/recurrent and uncontrolled Earlier this year, she had worsening due to stress at work Continue therapy Discussed synergistic effects of medication Also encouraged mindfulness and meditation No improvement from Celexa, so we will discontinue Start Zoloft 100 mg daily Discussed possible side effects Follow-up in 2 months and repeat PHQ-9 and GAD-7

## 2019-08-22 NOTE — Telephone Encounter (Signed)
LMTCB

## 2019-09-09 ENCOUNTER — Other Ambulatory Visit: Payer: Self-pay | Admitting: Family Medicine

## 2019-09-09 NOTE — Telephone Encounter (Signed)
Requested medication (s) are due for refill today: no  Requested medication (s) are on the active medication list: yes  Last refill:  08/15/2019  Future visit scheduled: no  Notes to clinic: Patient requesting a 90 day with 2 refills     Requested Prescriptions  Pending Prescriptions Disp Refills   sertraline (ZOLOFT) 100 MG tablet [Pharmacy Med Name: SERTRALINE HCL 100 MG TABLET] 90 tablet 2    Sig: TAKE 1 TABLET BY MOUTH EVERY DAY      Psychiatry:  Antidepressants - SSRI Passed - 09/09/2019  2:09 PM      Passed - Completed PHQ-2 or PHQ-9 in the last 360 days.      Passed - Valid encounter within last 6 months    Recent Outpatient Visits           3 weeks ago Moderate episode of recurrent major depressive disorder [facility])   [facility] [place], Dionne Bucy, MD   2 months ago Diarrhea, unspecified type   [facility], Dionne Bucy, MD   11 months ago Mass of right side of neck   [place], [place]   1 year ago Acute bilateral low back pain without sciatica   [facility], Dionne Bucy, MD   1 year ago Acute pharyngitis, unspecified etiology   [facility], Dionne Bucy, MD

## 2019-09-10 NOTE — Telephone Encounter (Signed)
LMTCB. PEC please schedule a two month fu from last visit for depression.

## 2019-09-11 NOTE — Telephone Encounter (Signed)
LMTCB to schedule her 2 mth fu for depression with Dr. Jacinto Reap

## 2019-10-15 ENCOUNTER — Other Ambulatory Visit: Payer: Self-pay | Admitting: Family Medicine

## 2020-01-15 ENCOUNTER — Ambulatory Visit: Payer: Self-pay | Admitting: Family Medicine

## 2020-01-15 NOTE — Telephone Encounter (Signed)
Pt reports diarrhea 2 days ago, since resolved. H/O IBS. States this AM dizzy, no spinning, "Floaty." Worsens sitting to standing, turning head, quick movements. States congested this AM "But not after I'd been up for a while." Also reports urine had been dark but hydrating "Drinking a lot of water and Pedialyte  Reports mild nausea at times and "Dull pinch mid back towards right." States HR elevated last night "But not now, also have anxiety."   Denies any dysuria, no headache, no visual change.  No known covid exposure. Unable to schedule with covid screening, pt called during lunch break. Advised UC/ED if symptoms worsen. Assured NT would route to practice for PCPs review  Please advise: . CB# [phone] Pt verbalizes understanding.   Reason for Disposition . [1] MODERATE dizziness (e.g., interferes with normal activities) AND [2] has NOT been evaluated by physician for this  (Exception: dizziness caused by heat exposure, sudden standing, or poor fluid intake)  Answer Assessment - Initial Assessment Questions 1. DESCRIPTION: "Describe your dizziness."     *Floaty 2. LIGHTHEADED: "Do you feel lightheaded?" (e.g., somewhat faint, woozy, weak upon standing)     Yes 3. VERTIGO: "Do you feel like either you or the room is spinning or tilting?" (i.e. vertigo)     no 4. SEVERITY: "How bad is it?"  "Do you feel like you are going to faint?" "Can you stand and walk?"   - MILD: Feels slightly dizzy, but walking normally.   - MODERATE: Feels very unsteady when walking, but not falling; interferes with normal activities (e.g., school, work) .   - SEVERE: Unable to walk without falling, or requires assistance to walk without falling; feels like passing out now.      moderate 5. ONSET:  "When did the dizziness begin?"     This AM 6. AGGRAVATING FACTORS: "Does anything make it worse?" (e.g., standing, change in head position)     Worse with standing,turning head, quick movements 7. HEART RATE: "Can  you tell me your heart rate?" "How many beats in 15 seconds?"  (Note: not all patients can do this)       *No Answer* 8. CAUSE: "What do you think is causing the dizziness?"     no 9. RECURRENT SYMPTOM: "Have you had dizziness before?" If Yes, ask: "When was the last time?" "What happened that time?"     *No Answer* 10. OTHER SYMPTOMS: "Do you have any other symptoms?" (e.g., fever, chest pain, vomiting, diarrhea, bleeding)       Dull right sided flank pain "Like a light pinch"  2/10 11. PREGNANCY: "Is there any chance you are pregnant?" "When was your last menstrual period?"       Had period 1 week ago.  Protocols used: DIZZINESS [facility]

## 2020-01-16 ENCOUNTER — Telehealth: Payer: BC Managed Care – PPO | Admitting: Adult Health

## 2020-01-16 ENCOUNTER — Encounter: Payer: Self-pay | Admitting: Adult Health

## 2020-01-16 DIAGNOSIS — N390 Urinary tract infection, site not specified: Secondary | ICD-10-CM

## 2020-01-16 MED ORDER — AMOXICILLIN-POT CLAVULANATE 875-125 MG PO TABS: 1.0000 | ORAL_TABLET | Freq: Two times a day (BID) | ORAL | 0 refills | Status: DC

## 2020-01-16 NOTE — Telephone Encounter (Signed)
Pt has an office visit with Cydne this afternoon

## 2020-01-16 NOTE — Telephone Encounter (Signed)
Ok to schedule in person with someone with those symptoms if she'd like to be seen

## 2020-01-16 NOTE — Progress Notes (Signed)
MyChart Video Visit    Virtual Visit via Video Note   This visit type was conducted due to national recommendations for restrictions regarding the COVID-19 Pandemic (e.g. social distancing) in an effort to limit this patient's exposure and mitigate transmission in our community. This patient is at least at moderate risk for complications without adequate follow up. This format is felt to be most appropriate for this patient at this time. Physical exam was limited by quality of the video and audio technology used for the visit.   Patient location: at home  Provider location: Provider: Provider's office at  [facility], [place].     I discussed the limitations of evaluation and management by telemedicine and the availability of in person appointments. The patient expressed understanding and agreed to proceed.  Patient: Tammy Washington   DOB: 1991/04/09   29 y.o. Female  MRN: [id number] Visit Date: 01/16/2020  Today's healthcare provider: Marcille Buffy, FNP   Chief Complaint  Patient presents with  . Urinary Tract Infection   Subjective    HPI  Patient is a 29 year old female with complaints of mild pinching feeing in her back that radiates to lower pelvis. She says its a quick dull pain intermittent x 2 days She reports the pain is  Happening more at night.  Denies any any blood in urine. Rates pain 2/10 on pain scale. Urine is slightly darker and odor to it for the past 2 days.  She reports a history of UTI's. Denies any urinary symptoms or change in stress.   3 days ago she had diarrhea and lasted 24 hours  and took over the counter Pepto. She has not had any stool since yesterday- was normal then. Denies any other abdominal pain.   Denies any blood. Did notice small amount of mucous in diarrhea  X 2 days. Seems constipated now after taking Pepto. Marland Kitchen History of IBS.   No LMP recorded.  01/08/20- no sexual activity since last menstrual.    Increased vaginal discharge, white in color, denies any itching. Denies any STD exposures or concerns. Denies any other vaginal symptoms.   Denies any urinary symptoms. Denies any injury. She reports she has been drinking more fluids and feels better.  Patient  denies any fever, body aches,chills, rash, chest pain, shortness of breath, nausea, vomiting, or diarrhea.  Denies dizziness, lightheadedness, pre syncopal or syncopal episodes.     Medications: Outpatient Medications Prior to Visit  Medication Sig  . buPROPion (WELLBUTRIN) 100 MG tablet Take 100 mg by mouth at bedtime.  . hydrOXYzine (ATARAX/VISTARIL) 10 MG tablet Take 1 tablet (10 mg total) by mouth 3 (three) times daily as needed.  . hydrOXYzine (ATARAX/VISTARIL) 25 MG tablet Take 25 mg by mouth 2 (two) times daily.  . sertraline (ZOLOFT) 100 MG tablet Take 1 tablet (100 mg total) by mouth daily.  . SUMAtriptan (IMITREX) 50 MG tablet Take 1 tablet (50 mg total) by mouth every 2 (two) hours as needed for migraine. May repeat in 2 hours if headache persists or recurs.   No facility-administered medications prior to visit.    Review of Systems  Constitutional: Negative.   HENT: Negative.   Respiratory: Negative.   Cardiovascular: Negative.   Gastrointestinal: Positive for constipation and diarrhea. Negative for abdominal distention, abdominal pain, anal bleeding, blood in stool and nausea.  Genitourinary: Positive for frequency and vaginal discharge. Negative for difficulty urinating, dyspareunia, dysuria, enuresis, flank pain, menstrual problem and vaginal  bleeding.  Musculoskeletal: Negative.   Neurological: Negative.   Psychiatric/Behavioral: Negative.      Assessment/Plan:   Urinary tract infection without hematuria, site unspecified I discussed  the assessment and treatment plan with the patient. The patient was provided an opportunity to ask questions and all were answered. The patient agreed with the plan and  demonstrated an understanding of the instructions.  Lab Orders  No laboratory test(s) ordered today    Meds ordered this encounter  Medications  . amoxicillin-clavulanate (AUGMENTIN) 875-125 MG tablet    Sig: Take 1 tablet by mouth 2 (two) times daily.    Dispense:  14 tablet    Refill:  0    The patient was advised to call back or seek an in-person evaluation if the symptoms worsen or if the condition fails to improve as anticipated.  I discussed the assessment and treatment plan with the patient. The patient was provided an opportunity to ask questions and all were answered. The patient agreed with the plan and demonstrated an understanding of the instructions.   The patient was advised to call back or seek an in-person evaluation if the symptoms worsen or if the condition fails to improve as anticipated.Discussed the importance of testing if any symptoms persist, change or worsen at anytime and need for in person visit.   I provided 25  minutes of non-face-to-face time during this encounter.   Ms. chakara, bognar are scheduled for a virtual visit with your provider today.    Just as we do with appointments in the office, we must obtain your consent to participate.  Your consent will be active for this visit and any virtual visit you may have with one of our providers in the next 365 days.    If you have a MyChart account, I can also send a copy of this consent to you electronically.  All virtual visits are billed to your insurance company just like a traditional visit in the office.  As this is a virtual visit, video technology does not allow for your provider to perform a traditional examination.  This may limit your provider's ability to fully assess your condition.  If your provider identifies any concerns that need to be evaluated in person or the need to arrange testing such as labs, EKG, etc, we will make arrangements to do so.    Although advances in technology are sophisticated, we  cannot ensure that it will always work on either your end or our end.  If the connection with a video visit is poor, we may have to switch to a telephone visit.  With either a video or telephone visit, we are not always able to ensure that we have a secure connection.   I need to obtain your verbal consent now.   Are you willing to proceed with your visit today?   SHANIAH BALTES has provided verbal consent on 01/18/2020 for a virtual visit (video or telephone).   Danyele Smejkal [place], FNP 01/18/2020  9:59 PM    Marcille Buffy, [place] [phone] (phone) [phone] (fax)  [place]

## 2020-01-18 DIAGNOSIS — N390 Urinary tract infection, site not specified: Secondary | ICD-10-CM | POA: Insufficient documentation

## 2020-01-26 ENCOUNTER — Telehealth (INDEPENDENT_AMBULATORY_CARE_PROVIDER_SITE_OTHER): Payer: BC Managed Care – PPO | Admitting: Physician Assistant

## 2020-01-26 ENCOUNTER — Encounter: Payer: Self-pay | Admitting: Physician Assistant

## 2020-01-26 ENCOUNTER — Ambulatory Visit: Payer: Self-pay

## 2020-01-26 DIAGNOSIS — K529 Noninfective gastroenteritis and colitis, unspecified: Secondary | ICD-10-CM

## 2020-01-26 DIAGNOSIS — R110 Nausea: Secondary | ICD-10-CM | POA: Diagnosis not present

## 2020-01-26 MED ORDER — CIPROFLOXACIN HCL 500 MG PO TABS: 500.0000 mg | ORAL_TABLET | Freq: Two times a day (BID) | ORAL | 0 refills | Status: DC

## 2020-01-26 MED ORDER — ONDANSETRON HCL 4 MG PO TABS: 4.0000 mg | ORAL_TABLET | Freq: Three times a day (TID) | ORAL | 0 refills | Status: DC | PRN

## 2020-01-26 MED ORDER — METRONIDAZOLE 500 MG PO TABS: 500.0000 mg | ORAL_TABLET | Freq: Two times a day (BID) | ORAL | 0 refills | Status: DC

## 2020-01-26 NOTE — Progress Notes (Signed)
MyChart Video Visit    Virtual Visit via Video Note   This visit type was conducted due to national recommendations for restrictions regarding the COVID-19 Pandemic (e.g. social distancing) in an effort to limit this patient's exposure and mitigate transmission in our community. This patient is at least at moderate risk for complications without adequate follow up. This format is felt to be most appropriate for this patient at this time. Physical exam was limited by quality of the video and audio technology used for the visit.   Patient location: Home Provider location: [facility]  I discussed the limitations of evaluation and management by telemedicine and the availability of in person appointments. The patient expressed understanding and agreed to proceed.  Patient: Tammy Washington   DOB: 1990-10-14   29 y.o. Female  MRN: [id number] Visit Date: 01/26/2020  Today's healthcare provider: Mar Daring, PA-C   Chief Complaint  Patient presents with  . Diarrhea   Subjective    Diarrhea  This is a new problem. The current episode started 1 to 4 weeks ago (2 weeks). The problem has been unchanged. The stool consistency is described as blood tinged, mucous and watery. The patient states that diarrhea awakens her from sleep. Associated symptoms include abdominal pain, bloating, sweats and weight loss. Pertinent negatives include no chills, fever, headaches or vomiting. Nothing aggravates the symptoms. Risk factors include recent antibiotic use. She has tried electrolyte solution and increased fluids (Imodium) for the symptoms. The treatment provided no relief.     Patient Active Problem List   Diagnosis Date Noted  . Urinary tract infection without hematuria 01/18/2020  . Diarrhea 06/17/2019  . GAD (generalized anxiety disorder) 06/17/2019  . Moderate episode of recurrent major depressive disorder ([place]) 06/17/2019  . Allergic rhinitis 11/19/2017  . TMJ (dislocation of  temporomandibular joint) 11/19/2017  . Migraines 11/19/2017  . Family history of polyps in the colon 11/19/2017  . Contraception 11/19/2017   Past Medical History:  Diagnosis Date  . Allergy   . H/O colonoscopy   . Migraine headache       Medications: Outpatient Medications Prior to Visit  Medication Sig  . buPROPion (WELLBUTRIN) 100 MG tablet Take 100 mg by mouth at bedtime.  . hydrOXYzine (ATARAX/VISTARIL) 25 MG tablet Take 25 mg by mouth 2 (two) times daily.  . SUMAtriptan (IMITREX) 50 MG tablet Take 1 tablet (50 mg total) by mouth every 2 (two) hours as needed for migraine. May repeat in 2 hours if headache persists or recurs.  Marland Kitchen amoxicillin-clavulanate (AUGMENTIN) 875-125 MG tablet Take 1 tablet by mouth 2 (two) times daily.  . hydrOXYzine (ATARAX/VISTARIL) 10 MG tablet Take 1 tablet (10 mg total) by mouth 3 (three) times daily as needed. (Patient not taking: Reported on 01/26/2020)  . sertraline (ZOLOFT) 100 MG tablet Take 1 tablet (100 mg total) by mouth daily.   No facility-administered medications prior to visit.    Review of Systems  Constitutional: Positive for appetite change and weight loss. Negative for chills, fatigue and fever.  Respiratory: Negative.   Cardiovascular: Negative.   Gastrointestinal: Positive for abdominal pain, bloating, diarrhea and nausea. Negative for blood in stool and vomiting.  Neurological: Negative for dizziness and headaches.    Last CBC Lab Results  Component Value Date   WBC 6.5 07/06/2019   HGB 13.0 07/06/2019   HCT 38.5 07/06/2019   MCV 87.5 07/06/2019   MCH 29.5 07/06/2019   RDW 12.9 07/06/2019   PLT 287 07/06/2019  Last metabolic panel Lab Results  Component Value Date   GLUCOSE 126 (H) 07/06/2019   NA 137 07/06/2019   K 3.1 (L) 07/06/2019   CL 106 07/06/2019   CO2 21 (L) 07/06/2019   BUN 9 07/06/2019   CREATININE 0.63 07/06/2019   GFRNONAA >60 07/06/2019   GFRAA >60 07/06/2019   CALCIUM 8.6 (L) 07/06/2019   PROT  7.4 07/06/2019   ALBUMIN 4.0 07/06/2019   LABGLOB 3.3 06/17/2019   AGRATIO 1.3 06/17/2019   BILITOT 0.2 (L) 07/06/2019   ALKPHOS 60 07/06/2019   AST 27 07/06/2019   ALT 23 07/06/2019   ANIONGAP 10 07/06/2019      Objective    There were no vitals taken for this visit. BP Readings from Last 3 Encounters:  07/06/19 128/85  09/27/18 123/85  02/08/18 126/84   Wt Readings from Last 3 Encounters:  08/15/19 247 lb (112 kg)  07/06/19 249 lb (112.9 kg)  09/27/18 252 lb (114.3 kg)      Physical Exam Vitals reviewed.  Constitutional:      General: She is not in acute distress.    Appearance: Normal appearance. She is well-developed. She is not ill-appearing.  HENT:     Head: Normocephalic and atraumatic.  Pulmonary:     Effort: Pulmonary effort is normal. No respiratory distress.  Musculoskeletal:     Cervical back: Normal range of motion and neck supple.  Neurological:     Mental Status: She is alert.  Psychiatric:        Behavior: Behavior normal.        Thought Content: Thought content normal.        Judgment: Judgment normal.        Assessment & Plan     1. Colitis, acute Suspect colitis. Will treat with cipro and flagyl as below. Will order stool cultures as below. Will f/u pending results. Call if symptoms worsen. - ciprofloxacin (CIPRO) 500 MG tablet; Take 1 tablet (500 mg total) by mouth 2 (two) times daily.  Dispense: 14 tablet; Refill: 0 - metroNIDAZOLE (FLAGYL) 500 MG tablet; Take 1 tablet (500 mg total) by mouth 2 (two) times daily.  Dispense: 14 tablet; Refill: 0 - GI Profile, Stool, PCR  2. Nausea Zofran for nausea.  - ondansetron (ZOFRAN) 4 MG tablet; Take 1 tablet (4 mg total) by mouth every 8 (eight) hours as needed.  Dispense: 20 tablet; Refill: 0   No follow-ups on file.     I discussed the assessment and treatment plan with the patient. The patient was provided an opportunity to ask questions and all were answered. The patient agreed with the  plan and demonstrated an understanding of the instructions.   The patient was advised to call back or seek an in-person evaluation if the symptoms worsen or if the condition fails to improve as anticipated.  I provided 18 minutes of face-to-face time during this encounter via MyChart Video enabled encounter.  Reynolds Bowl, PA-C, have reviewed all documentation for this visit. The documentation on 01/29/20 for the exam, diagnosis, procedures, and orders are all accurate and complete.  [place] [facility] [phone] (phone) [phone] (fax)  [place]

## 2020-01-26 NOTE — Telephone Encounter (Signed)
Pt. Reports she started having diarrhea 2 weeks ago. Has IBS per pt. Completed antibiotics recently for a UTI. Has 6 loose stools daily with mucus in the stool. Has nausea and dizziness as well. "Trying to stay hydrated with water and Pedialyte." Virtual visit made.  Reason for Disposition . [1] MODERATE diarrhea (e.g., 4-6 times / day more than normal) AND [2] present > 48 hours (2 days)  Answer Assessment - Initial Assessment Questions 1. DIARRHEA SEVERITY: "How bad is the diarrhea?" "How many extra stools have you had in the past 24 hours than normal?"    - NO DIARRHEA (SCALE 0)   - MILD (SCALE 1-3): Few loose or mushy BMs; increase of 1-3 stools over normal daily number of stools; mild increase in ostomy output.   -  MODERATE (SCALE 4-7): Increase of 4-6 stools daily over normal; moderate increase in ostomy output. * SEVERE (SCALE 8-10; OR 'WORST POSSIBLE'): Increase of 7 or more stools daily over normal; moderate increase in ostomy output; incontinence.     6 2. ONSET: "When did the diarrhea begin?"      2 weeks ago 3. BM CONSISTENCY: "How loose or watery is the diarrhea?"      Loose, mucus 4. VOMITING: "Are you also vomiting?" If Yes, ask: "How many times in the past 24 hours?"      Nausea 5. ABDOMINAL PAIN: "Are you having any abdominal pain?" If Yes, ask: "What does it feel like?" (e.g., crampy, dull, intermittent, constant)      Yes - 6. ABDOMINAL PAIN SEVERITY: If present, ask: "How bad is the pain?"  (e.g., Scale 1-10; mild, moderate, or severe)   - MILD (1-3): doesn't interfere with normal activities, abdomen soft and not tender to touch    - MODERATE (4-7): interferes with normal activities or awakens from sleep, tender to touch    - SEVERE (8-10): excruciating pain, doubled over, unable to do any normal activities       9 7. ORAL INTAKE: If vomiting, "Have you been able to drink liquids?" "How much fluids have you had in the past 24 hours?"     Yes 8. HYDRATION: "Any signs of  dehydration?" (e.g., dry mouth [not just dry lips], too weak to stand, dizziness, new weight loss) "When did you last urinate?"     Some dizziness 9. EXPOSURE: "Have you traveled to a foreign country recently?" "Have you been exposed to anyone with diarrhea?" "Could you have eaten any food that was spoiled?"     No 10. ANTIBIOTIC USE: "Are you taking antibiotics now or have you taken antibiotics in the past 2 months?"       Yes 11. OTHER SYMPTOMS: "Do you have any other symptoms?" (e.g., fever, blood in stool)       Nausea 12. PREGNANCY: "Is there any chance you are pregnant?" "When was your last menstrual period?"       No  Protocols used: DIARRHEA-A-AH

## 2020-01-26 NOTE — Patient Instructions (Signed)
Colitis ° °Colitis is inflammation of the colon. Colitis may last a short time (be acute), or it may last a long time (become chronic). °What are the causes? °This condition may be caused by: °· Viruses. °· Bacteria. °· Reaction to medicine. °· Certain autoimmune diseases such as Crohn's disease or ulcerative colitis. °· Radiation treatment. °· Decreased blood flow to the bowel (ischemia). °What are the signs or symptoms? °Symptoms of this condition include: °· Watery diarrhea. °· Passing bloody or tarry stool. °· Pain. °· Fever. °· Vomiting. °· Tiredness (fatigue). °· Weight loss. °· Bloating. °· Abdominal pain. °· Having fewer bowel movements than usual. °· A strong and sudden urge to have a bowel movement. °· Feeling like the bowel is not empty after a bowel movement. °How is this diagnosed? °This condition is diagnosed with a stool test or a blood test. °You may also have other tests, such as: °· X-rays. °· CT scan. °· Colonoscopy. °· Endoscopy. °· Biopsy. °How is this treated? °Treatment for this condition depends on the cause. The condition may be treated by: °· Resting the bowel. This involves not eating or drinking for a period of time. °· Fluids that are given through an IV. °· Medicine for pain and diarrhea. °· Antibiotic medicines. °· Cortisone medicines. °· Surgery. °Follow these instructions at home: °Eating and drinking ° °· Follow instructions from your health care provider about eating or drinking restrictions. °· Drink enough fluid to keep your urine pale yellow. °· Work with a dietitian to determine which foods cause your condition to flare up. °· Avoid foods that cause flare-ups. °· Eat a well-balanced diet. °General instructions °· If you were prescribed an antibiotic medicine, take it as told by your health care provider. Do not stop taking the antibiotic even if you start to feel better. °· Take over-the-counter and prescription medicines only as told by your health care provider. °· Keep all  follow-up visits as told by your health care provider. This is important. °Contact a health care provider if: °· Your symptoms do not go away. °· You develop new symptoms. °Get help right away if you: °· Have a fever that does not go away with treatment. °· Develop chills. °· Have extreme weakness, fainting, or dehydration. °· Have repeated vomiting. °· Develop severe pain in your abdomen. °· Pass bloody or tarry stool. °Summary °· Colitis is inflammation of the colon. Colitis may last a short time (be acute), or it may last a long time (become chronic). °· Treatment for this condition depends on the cause and may include resting the bowel, taking medicines, or having surgery. °· If you were prescribed an antibiotic medicine, take it as told by your health care provider. Do not stop taking the antibiotic even if you start to feel better. °· Get help right away if you develop severe pain in your abdomen. °· Keep all follow-up visits as told by your health care provider. This is important. °This information is not intended to replace advice given to you by your health care provider. Make sure you discuss any questions you have with your health care provider. °Document Revised: 10/11/2017 Document Reviewed: 10/11/2017 °Elsevier Patient Education © 2020 [employer] ° °

## 2020-02-19 ENCOUNTER — Ambulatory Visit: Payer: BC Managed Care – PPO | Admitting: Family Medicine

## 2020-04-27 ENCOUNTER — Ambulatory Visit
Admission: RE | Admit: 2020-04-27 | Discharge: 2020-04-27 | Disposition: A | Discharge status: Home / Self Care | Payer: Self-pay | Source: Ambulatory Visit | Attending: Family Medicine | Admitting: Family Medicine

## 2020-04-27 ENCOUNTER — Other Ambulatory Visit: Payer: Self-pay

## 2020-04-27 ENCOUNTER — Ambulatory Visit
Admission: RE | Admit: 2020-04-27 | Discharge: 2020-04-27 | Disposition: A | Discharge status: Home / Self Care | Payer: Self-pay | Attending: Family Medicine | Admitting: Family Medicine

## 2020-04-27 ENCOUNTER — Ambulatory Visit: Payer: BC Managed Care – PPO | Admitting: Family Medicine

## 2020-04-27 ENCOUNTER — Encounter: Payer: Self-pay | Admitting: Family Medicine

## 2020-04-27 VITALS — BP 124/86 | HR 81 | Temp 98.3°F | Ht 72.0 in | Wt 247.0 lb

## 2020-04-27 DIAGNOSIS — K644 Residual hemorrhoidal skin tags: Secondary | ICD-10-CM | POA: Diagnosis not present

## 2020-04-27 DIAGNOSIS — K602 Anal fissure, unspecified: Secondary | ICD-10-CM | POA: Diagnosis not present

## 2020-04-27 DIAGNOSIS — K582 Mixed irritable bowel syndrome: Secondary | ICD-10-CM

## 2020-04-27 DIAGNOSIS — F411 Generalized anxiety disorder: Secondary | ICD-10-CM

## 2020-04-27 MED ORDER — DULOXETINE HCL 30 MG PO CPEP: 30.0000 mg | ORAL_CAPSULE | Freq: Every day | ORAL | 3 refills | Status: DC

## 2020-04-27 MED ORDER — NITROGLYCERIN 0.4 % RE OINT: TOPICAL_OINTMENT | RECTAL | 0 refills | Status: DC

## 2020-04-27 MED ORDER — LIDOCAINE 5 % EX OINT: 1.0000 "application " | TOPICAL_OINTMENT | CUTANEOUS | 0 refills | Status: DC | PRN

## 2020-04-27 NOTE — Assessment & Plan Note (Signed)
Ongoing issue Previously with primarily diarrhea symptoms, but now with worsening constipation and overflow diarrhea and fecal leakage Abdominal x-ray to evaluate degree of constipation Discussed low FODMAP diet and avoiding medications that cause constipation Discussed that Zofran and hydroxyzine could both lead to constipation Pending x-ray results, plan to start MiraLAX Referral to GI for further evaluation and management Return precautions discussed

## 2020-04-27 NOTE — Patient Instructions (Signed)

## 2020-04-27 NOTE — Assessment & Plan Note (Signed)
New problem Likely 2/2 constipation Treat with nitro and lidocaine topically Referral to GI for IBS symptoms as below Also with early external hemorrhoid that will be treated with same medications

## 2020-04-27 NOTE — Progress Notes (Signed)
Established patient visit   Patient: Tammy Washington   DOB: [birth year]   30 y.o. Female  MRN: [id number] Visit Date: 04/27/2020  Today's healthcare provider: Lavon Paganini, MD   Chief Complaint  Patient presents with  . Irritable Bowel Syndrome   Subjective    HPI  Irritable Bowel Syndrome Patient complains of abdominal cramping and irritable bowel syndrome. Onset of symptoms was several months ago. Current symptoms: crampy abdominal pain: severe: location: in the lower abdomen, abdominal bloating: moderate, loose stools occurring comes and goes times per day and anxiety: severe. Patient denies anorexia, fever, frequency, hematuria and vomiting. Symptoms have gradually worsened. Previous visits for this problem: last in 10/21. Evaluation to date has been none.    Primarily constipation, but then will have a day of liquid stools.  She had stool studies ordered but did not get them.  Has had episodes of fecal incontinence, typically in the car. Anxiety seems to make it worse.  Had presumed colitis in 10/21 and was treated with Cipro/Flagyl.  Tried miralax for constipation and it helped and then got diarrhea afterwards. Still has BM daily, but it is hard. She cut out coffee to see if this would help with diarrhea. Feels like razor blades when having BMs.  Patient Active Problem List   Diagnosis Date Noted  . Irritable bowel syndrome with both constipation and diarrhea 04/27/2020  . Anal fissure 04/27/2020  . Urinary tract infection without hematuria 01/18/2020  . Diarrhea 06/17/2019  . GAD (generalized anxiety disorder) 06/17/2019  . Moderate episode of recurrent major depressive disorder ([place]) 06/17/2019  . Allergic rhinitis 11/19/2017  . 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 Migraines 11/19/2017  . Family history of polyps in the colon 11/19/2017  . Contraception 11/19/2017   Past Medical History:  Diagnosis Date  . Allergy   . H/O colonoscopy    . Migraine headache    Social History   Tobacco Use  . Smoking status: Former Smoker    Years: 3.00    Types: Cigarettes    Quit date: 04/24/2015    Years since quitting: 5.0  . Smokeless tobacco: Never Used  . Tobacco comment: former social smoker  Vaping Use  . Vaping Use: Never used  Substance Use Topics  . Alcohol use: Yes    Alcohol/week: 1.0 - 2.0 standard drink    Types: 1 - 2 Cans of beer per week  . Drug use: Never   No Known Allergies   Medications: Outpatient Medications Prior to Visit  Medication Sig  . hydrOXYzine (ATARAX/VISTARIL) 25 MG tablet Take 25 mg by mouth 2 (two) times daily.  . ondansetron (ZOFRAN) 4 MG tablet Take 1 tablet (4 mg total) by mouth every 8 (eight) hours as needed.  . SUMAtriptan (IMITREX) 50 MG tablet Take 1 tablet (50 mg total) by mouth every 2 (two) hours as needed for migraine. May repeat in 2 hours if headache persists or recurs.  . [DISCONTINUED] buPROPion (WELLBUTRIN) 100 MG tablet Take 100 mg by mouth at bedtime.  . [DISCONTINUED] ciprofloxacin (CIPRO) 500 MG tablet Take 1 tablet (500 mg total) by mouth 2 (two) times daily.  . [DISCONTINUED] hydrOXYzine (ATARAX/VISTARIL) 10 MG tablet Take 1 tablet (10 mg total) by mouth 3 (three) times daily as needed.  . [DISCONTINUED] metroNIDAZOLE (FLAGYL) 500 MG tablet Take 1 tablet (500 mg total) by mouth 2 (two) times daily.   No facility-administered medications prior to visit.    Review of  Systems  Constitutional: Negative.   Respiratory: Negative.   Cardiovascular: Negative.   Gastrointestinal: Positive for abdominal pain, anal bleeding, constipation, diarrhea and rectal pain. Negative for abdominal distention, blood in stool, nausea and vomiting.  Genitourinary: Positive for pelvic pain.  Musculoskeletal: Positive for back pain.  Neurological: Negative for dizziness, light-headedness and headaches.      Objective    BP 124/86 (BP Location: Left Arm, Patient Position: Sitting,  Cuff Size: Large)   Pulse 81   Temp 98.3 F (36.8 C) (Oral)   Ht 6' (1.829 m)   Wt 247 lb (112 kg)   LMP 04/24/2020   BMI 33.50 kg/m    Physical Exam Vitals reviewed.  Constitutional:      General: She is not in acute distress.    Appearance: She is well-developed.  HENT:     Head: Normocephalic and atraumatic.  Eyes:     General: No scleral icterus.    Conjunctiva/sclera: Conjunctivae normal.  Cardiovascular:     Rate and Rhythm: Normal rate and regular rhythm.  Pulmonary:     Effort: Pulmonary effort is normal. No respiratory distress.  Abdominal:     General: Bowel sounds are normal. There is no distension.     Palpations: Abdomen is soft. There is no mass.     Tenderness: There is abdominal tenderness (RLQ, LLQ). There is no guarding or rebound.  Genitourinary:    Rectum: Anal fissure and external hemorrhoid present.  Skin:    General: Skin is warm and dry.     Findings: No rash.  Neurological:     Mental Status: She is alert and oriented to person, place, and time. Mental status is at baseline.  Psychiatric:        Mood and Affect: Mood is anxious. Affect is tearful.        Behavior: Behavior normal.       No results found for any visits on 04/27/20.  Assessment & Plan     Problem List Items Addressed This Visit      Digestive   Irritable bowel syndrome with both constipation and diarrhea - Primary    Ongoing issue Previously with primarily diarrhea symptoms, but now with worsening constipation and overflow diarrhea and fecal leakage Abdominal x-ray to evaluate degree of constipation Discussed low FODMAP diet and avoiding medications that cause constipation Discussed that Zofran and hydroxyzine could both lead to constipation Pending x-ray results, plan to start MiraLAX Referral to GI for further evaluation and management Return precautions discussed      Relevant Orders   Ambulatory referral to Gastroenterology   DG Abd 1 View   Anal fissure     New problem Likely 2/2 constipation Treat with nitro and lidocaine topically Referral to GI for IBS symptoms as below Also with early external hemorrhoid that will be treated with same medications      Relevant Orders   Ambulatory referral to Gastroenterology     Other   GAD (generalized anxiety disorder)    Chronic and uncontrolled Recent worsening this seems to affect her bowel habits as well Did not see much improvement on Zoloft or Celexa previously Taking hydroxyzine often for panic symptoms Discussed importance of baseline control May still use hydroxyzine as needed Trial of Cymbalta 30 mg daily Discussed that it can take build time for this to reach full efficacy Discussed possible side effects Encouraged therapy Follow-up in 6 to 8 weeks      Relevant Medications   DULoxetine (CYMBALTA) 30  MG capsule    Other Visit Diagnoses    External hemorrhoids           Return if symptoms worsen or fail to improve.      I, Shirlee Latch, MD, have reviewed all documentation for this visit. The documentation on 04/27/20 for the exam, diagnosis, procedures, and orders are all accurate and complete.   Sherhonda Gaspar, Marzella Schlein, MD, MPH [facility]

## 2020-04-27 NOTE — Assessment & Plan Note (Signed)
Chronic and uncontrolled Recent worsening this seems to affect her bowel habits as well Did not see much improvement on Zoloft or Celexa previously Taking hydroxyzine often for panic symptoms Discussed importance of baseline control May still use hydroxyzine as needed Trial of Cymbalta 30 mg daily Discussed that it can take build time for this to reach full efficacy Discussed possible side effects Encouraged therapy Follow-up in 6 to 8 weeks

## 2020-05-20 ENCOUNTER — Other Ambulatory Visit: Payer: Self-pay | Admitting: Family Medicine

## 2020-06-24 ENCOUNTER — Encounter: Payer: Self-pay | Admitting: Gastroenterology

## 2020-06-24 ENCOUNTER — Ambulatory Visit (INDEPENDENT_AMBULATORY_CARE_PROVIDER_SITE_OTHER): Payer: Self-pay | Admitting: Gastroenterology

## 2020-06-24 ENCOUNTER — Other Ambulatory Visit: Payer: Self-pay

## 2020-06-24 VITALS — BP 120/91 | HR 96 | Ht 72.0 in | Wt 237.6 lb

## 2020-06-24 DIAGNOSIS — R109 Unspecified abdominal pain: Secondary | ICD-10-CM

## 2020-06-24 DIAGNOSIS — K602 Anal fissure, unspecified: Secondary | ICD-10-CM

## 2020-06-24 DIAGNOSIS — R197 Diarrhea, unspecified: Secondary | ICD-10-CM

## 2020-06-24 NOTE — Patient Instructions (Signed)
Please call [facility] at  [phone]. They are located at [address] [place], [place] [address]. Please apply the cream three times a day for 4 weeks.

## 2020-06-24 NOTE — Progress Notes (Signed)
Tammy Washington [address]  [place]  [place], [place] [address]  Main: [phone]  Fax: [phone]   Gastroenterology Consultation  Referring Provider:     Virginia Crews, MD Primary Care Physician:  Virginia Crews, MD Reason for Consultation:     IBS        HPI:    Chief Complaint  Patient presents with  . New Patient (Initial Visit)    IBS    Tammy Washington is a 30 y.o. y/o female referred for consultation & management  by Dr. Brita Romp, Dionne Bucy, MD.  Patient reports 31-month history of altered bowel habits.  States she will have a bowel movement every day but on some days she will have a hard stool, and another day she will have 5-6 loose bowel movements.  No blood in stool.  Has had a colonoscopy in 2019 that was done for screening due to family history of colon polyps in her sister and this did not show any polyps.  Please see procedure report for details.  She also reports stomach cramping and diarrhea every time she eats.  No nausea or vomiting.  Is afraid to go out and eat and has been staying at home due to her symptoms.  Reports weight loss due to decrease in appetite due to her symptoms.  Has tried several measures including diet changes, Metamucil, Citrucel, probiotics and other over-the-counter medications to help avoid constipation and diarrhea but this does not help her symptoms.  Past Medical History:  Diagnosis Date  . Allergy   . H/O colonoscopy   . Migraine headache     Past Surgical History:  Procedure Laterality Date  . COLONOSCOPY WITH PROPOFOL N/A 12/12/2017   Procedure: COLONOSCOPY WITH PROPOFOL;  Surgeon: Lin Landsman, MD;  Location: [facility];  Service: Endoscopy;  Laterality: N/A;  . NO PAST SURGERIES      Prior to Admission medications   Medication Sig Start Date End Date Taking? Authorizing Provider  DULoxetine (CYMBALTA) 30 MG capsule TAKE 1 CAPSULE BY MOUTH EVERY DAY Patient not taking:  Reported on 06/24/2020 05/20/20   Virginia Crews, MD  hydrOXYzine (ATARAX/VISTARIL) 25 MG tablet Take 25 mg by mouth 2 (two) times daily. Patient not taking: Reported on 06/24/2020 09/25/19   [provider]  lidocaine (XYLOCAINE) 5 % ointment Apply 1 application topically as needed. Patient not taking: Reported on 06/24/2020 04/27/20   Virginia Crews, MD  Nitroglycerin 0.4 % OINT Apply topically to anal fissure twice daily Patient not taking: Reported on 06/24/2020 04/27/20   Virginia Crews, MD  ondansetron (ZOFRAN) 4 MG tablet Take 1 tablet (4 mg total) by mouth every 8 (eight) hours as needed. Patient not taking: Reported on 06/24/2020 01/26/20   Mar Daring, PA-C  SUMAtriptan (IMITREX) 50 MG tablet Take 1 tablet (50 mg total) by mouth every 2 (two) hours as needed for migraine. May repeat in 2 hours if headache persists or recurs. Patient not taking: Reported on 06/24/2020 11/19/17   Virginia Crews, MD    Family History  Problem Relation Age of Onset  . Healthy Mother   . Rheum arthritis Father   . Colon polyps Sister 80       serrated adenoma  . Dementia Maternal Grandmother   . Healthy Sister   . Colon cancer Neg Hx   . Breast cancer Neg Hx   . Ovarian cancer Neg Hx   . Cervical cancer Neg  Hx      Social History   Tobacco Use  . Smoking status: Former Smoker    Years: 3.00    Types: Cigarettes    Quit date: 04/24/2015    Years since quitting: 5.1  . Smokeless tobacco: Never Used  . Tobacco comment: former social smoker  Vaping Use  . Vaping Use: Never used  Substance Use Topics  . Alcohol use: Yes    Alcohol/week: 1.0 - 2.0 standard drink    Types: 1 - 2 Cans of beer per week  . Drug use: Never    Allergies as of 06/24/2020  . (No Known Allergies)    Review of Systems:    All systems reviewed and negative except where noted in HPI.   Physical Exam:  BP (!) 120/91 (BP Location: Right Arm, Patient Position: Sitting, Cuff Size: Large)    Pulse 96   Ht 6' (1.829 m)   Wt 237 lb 9.6 oz (107.8 kg)   BMI 32.22 kg/m  No LMP recorded. Psych:  Alert and cooperative. Normal mood and affect. General:   Alert,  Well-developed, well-nourished, pleasant and cooperative in NAD Head:  Normocephalic and atraumatic. Eyes:  Sclera clear, no icterus.   Conjunctiva pink. Ears:  Normal auditory acuity. Nose:  No deformity, discharge, or lesions. Mouth:  No deformity or lesions,oropharynx pink & moist. Neck:  Supple; no masses or thyromegaly. Abdomen:  Normal bowel sounds.  No bruits.  Soft, non-tender and non-distended without masses, hepatosplenomegaly or hernias noted.  No guarding or rebound tenderness.    Rectal exam: Small anal fissure present posteriorly, no external perianal lesions, no masses in rectal vault, brown stool in rectal vault Msk:  Symmetrical without gross deformities. Good, equal movement & strength bilaterally. Pulses:  Normal pulses noted. Extremities:  No clubbing or edema.  No cyanosis. Neurologic:  Alert and oriented x3;  grossly normal neurologically. Skin:  Intact without significant lesions or rashes. No jaundice. Lymph Nodes:  No significant cervical adenopathy. Psych:  Alert and cooperative. Normal mood and affect.   Labs: CBC    Component Value Date/Time   WBC 6.5 07/06/2019 0047   RBC 4.40 07/06/2019 0047   HGB 13.0 07/06/2019 0047   HGB 13.5 06/17/2019 1414   HCT 38.5 07/06/2019 0047   HCT 40.3 06/17/2019 1414   PLT 287 07/06/2019 0047   PLT 326 06/17/2019 1414   MCV 87.5 07/06/2019 0047   MCV 90 06/17/2019 1414   MCH 29.5 07/06/2019 0047   MCHC 33.8 07/06/2019 0047   RDW 12.9 07/06/2019 0047   RDW 12.6 06/17/2019 1414   LYMPHSABS 1.4 06/17/2019 1414   EOSABS 0.1 06/17/2019 1414   BASOSABS 0.1 06/17/2019 1414   CMP     Component Value Date/Time   NA 137 07/06/2019 0047   NA 138 06/17/2019 1414   K 3.1 (L) 07/06/2019 0047   CL 106 07/06/2019 0047   CO2 21 (L) 07/06/2019 0047    GLUCOSE 126 (H) 07/06/2019 0047   BUN 9 07/06/2019 0047   BUN 8 06/17/2019 1414   CREATININE 0.63 07/06/2019 0047   CALCIUM 8.6 (L) 07/06/2019 0047   PROT 7.4 07/06/2019 0047   PROT 7.7 06/17/2019 1414   ALBUMIN 4.0 07/06/2019 0047   ALBUMIN 4.4 06/17/2019 1414   AST 27 07/06/2019 0047   ALT 23 07/06/2019 0047   ALKPHOS 60 07/06/2019 0047   BILITOT 0.2 (L) 07/06/2019 0047   BILITOT 0.3 06/17/2019 1414   GFRNONAA >60 07/06/2019 0047  GFRAA >60 07/06/2019 0047    Imaging Studies: No results found.  Assessment and Plan:   CENIYA FOWERS is a 30 y.o. y/o female has been referred for IBS  Patient symptoms are new over the last 8 months and she states she has never had symptoms like this before  I will start by obtaining stool work-up to rule out infectious etiology, evaluate for any evidence of inflammation with fecal calprotectin, obtain fecal pancreatic elastase as well  Obtain H. pylori breath test  If above measures are unrevealing and symptoms continue, consider EGD and colonoscopy  We did discuss proceeding with scheduling the procedures at this time given ongoing symptoms for 8 months of patients frustration with symptoms despite her trying several measures to help alleviate the symptoms.  However, she would rather proceed with conservative management first as above and then reconsider EGD and colonoscopy accordingly  Prescribe nifedipine ointment for anal fissure.  She states anal fissure symptoms are better than before and she does not have as much pain.  Nitroglycerin ointment was prescribed by PCP but patient was unable to get this due to high cost  High-fiber diet   Dr Tammy Washington  Speech recognition software was used to dictate the above note.

## 2020-07-21 ENCOUNTER — Ambulatory Visit: Payer: Self-pay | Admitting: *Deleted

## 2020-07-21 ENCOUNTER — Other Ambulatory Visit: Payer: Self-pay

## 2020-07-21 ENCOUNTER — Encounter: Payer: Self-pay | Admitting: Emergency Medicine

## 2020-07-21 ENCOUNTER — Emergency Department
Admission: EM | Admit: 2020-07-21 | Discharge: 2020-07-21 | Disposition: A | Discharge status: Home / Self Care | Payer: BC Managed Care – PPO | Attending: Emergency Medicine | Admitting: Emergency Medicine

## 2020-07-21 ENCOUNTER — Emergency Department: Payer: BC Managed Care – PPO

## 2020-07-21 DIAGNOSIS — R1013 Epigastric pain: Secondary | ICD-10-CM

## 2020-07-21 DIAGNOSIS — R10816 Epigastric abdominal tenderness: Secondary | ICD-10-CM | POA: Diagnosis present

## 2020-07-21 DIAGNOSIS — K580 Irritable bowel syndrome with diarrhea: Secondary | ICD-10-CM | POA: Diagnosis not present

## 2020-07-21 DIAGNOSIS — Z87891 Personal history of nicotine dependence: Secondary | ICD-10-CM | POA: Insufficient documentation

## 2020-07-21 DIAGNOSIS — K589 Irritable bowel syndrome without diarrhea: Secondary | ICD-10-CM

## 2020-07-21 LAB — CBC
HCT: 40.2 % (ref 36.0–46.0)
Hemoglobin: 13.5 g/dL (ref 12.0–15.0)
MCH: 29 pg (ref 26.0–34.0)
MCHC: 33.6 g/dL (ref 30.0–36.0)
MCV: 86.5 fL (ref 80.0–100.0)
Platelets: 295 10*3/uL (ref 150–400)
RBC: 4.65 MIL/uL (ref 3.87–5.11)
RDW: 14.4 % (ref 11.5–15.5)
WBC: 5.3 10*3/uL (ref 4.0–10.5)
nRBC: 0 % (ref 0.0–0.2)

## 2020-07-21 LAB — URINALYSIS, COMPLETE (UACMP) WITH MICROSCOPIC
Bilirubin Urine: NEGATIVE
Glucose, UA: NEGATIVE mg/dL
Hgb urine dipstick: NEGATIVE
Ketones, ur: NEGATIVE mg/dL
Leukocytes,Ua: NEGATIVE
Nitrite: NEGATIVE
Protein, ur: NEGATIVE mg/dL
Specific Gravity, Urine: 1.003 — ABNORMAL LOW (ref 1.005–1.030)
pH: 6 (ref 5.0–8.0)

## 2020-07-21 LAB — COMPREHENSIVE METABOLIC PANEL
ALT: 18 U/L (ref 0–44)
AST: 19 U/L (ref 15–41)
Albumin: 4.5 g/dL (ref 3.5–5.0)
Alkaline Phosphatase: 63 U/L (ref 38–126)
Anion gap: 12 (ref 5–15)
BUN: 9 mg/dL (ref 6–20)
CO2: 22 mmol/L (ref 22–32)
Calcium: 9.4 mg/dL (ref 8.9–10.3)
Chloride: 104 mmol/L (ref 98–111)
Creatinine, Ser: 0.68 mg/dL (ref 0.44–1.00)
GFR, Estimated: >60 mL/min (ref >60)
Glucose, Bld: 104 mg/dL — ABNORMAL HIGH (ref 70–99)
Potassium: 3.6 mmol/L (ref 3.5–5.1)
Sodium: 138 mmol/L (ref 135–145)
Total Bilirubin: 0.8 mg/dL (ref 0.3–1.2)
Total Protein: 8.3 g/dL — ABNORMAL HIGH (ref 6.5–8.1)

## 2020-07-21 LAB — LIPASE, BLOOD: Lipase: 30 U/L (ref 11–51)

## 2020-07-21 LAB — POC URINE PREG, ED: Preg Test, Ur: NEGATIVE

## 2020-07-21 MED ORDER — DICYCLOMINE HCL 10 MG PO CAPS: 10.0000 mg | ORAL_CAPSULE | Freq: Four times a day (QID) | ORAL | 0 refills | Status: DC

## 2020-07-21 NOTE — ED Triage Notes (Signed)
Pt comes into the ED via POV c/o upper abdominal pain, nausea, and diarrhea that has been ongoing for 2 weeks.  Pt ambulatory to triage at this time with even and unlabored respirations.  Pt still has gallbladder and appendix.  Pt states she has been taking imodium with some relief.  Pt gets worse when eating and she states there has been in a change in the appearance of the stools.

## 2020-07-21 NOTE — Telephone Encounter (Signed)
Upper center abdominal pain intermittent for two weeks. Today it is more constant and at times goes towards the pelvic area. Diarrhea during this time on average 4/day stool appears grainy and yellowish at times.No blood in stool. Occasional dizziness that resolves quickly. Denies fever/SOB/CP. Had GI consultation and tests ordered which she needs to take in stool sample, recommeded today. Advised increasing daily water intake to prevent dehydration, small frequent meals of the brat diet. Elevate feet/legs when she feels dizzy. With any increased or longer duration with sever pain, seek evaluation immediately. Patient verbalized understanding.    Reason for Disposition . Abdominal pain is a chronic symptom (recurrent or ongoing AND present > 4 weeks)  Answer Assessment - Initial Assessment Questions 1. LOCATION: "Where does it hurt?"      Upper abdomen 2. RADIATION: "Does the pain shoot anywhere else?" (e.g., chest, back) Sometimes to lower pelvic and the back     3. ONSET: "When did the pain begin?" (e.g., minutes, hours or days ago) Two weeks ago      4. SUDDEN: "Gradual or sudden onset?"     sudden 5. PATTERN "Does the pain come and go, or is it constant?"    - If constant: "Is it getting better, staying the same, or worsening?"      (Note: Constant means the pain never goes away completely; most serious pain is constant and it progresses)     - If intermittent: "How long does it last?" "Do you have pain now?"     (Note: Intermittent means the pain goes away completely between bouts)     Today is constant pain, but usually comes and goes 6. SEVERITY: "How bad is the pain?"  (e.g., Scale 1-10; mild, moderate, or severe)    - MILD (1-3): doesn't interfere with normal activities, abdomen soft and not tender to touch     - MODERATE (4-7): interferes with normal activities or awakens from sleep, tender to touch     - SEVERE (8-10): excruciating pain, doubled over, unable to do any normal  activities       6 7. RECURRENT SYMPTOM: "Have you ever had this type of stomach pain before?" If Yes, ask: "When was the last time?" and "What happened that time?"      Possibly has IBS 8. AGGRAVATING FACTORS: "Does anything seem to cause this pain?" (e.g., foods, stress, alcohol)     Eating out a lot the week before it started 9. CARDIAC SYMPTOMS: "Do you have any of the following symptoms: chest pain, difficulty breathing, sweating, nausea?"    no 10. OTHER SYMPTOMS: "Do you have any other symptoms?" (e.g., fever, vomiting, diarrhea)       Diarrhea the entire time.11. PREGNANCY: "Is there any chance you are pregnant?" "When was your last menstrual period?"       Just completed period  Protocols used: ABDOMINAL PAIN - UPPER-A-AH

## 2020-07-21 NOTE — Discharge Instructions (Signed)
Your ultrasound was negative.  Follow-up with GI.  Return to the ER for fevers, worsening pain or any other concerns

## 2020-07-21 NOTE — ED Provider Notes (Signed)
[facility] Emergency Department Provider Note  ____________________________________________   Event Date/Time   First MD Initiated Contact with Patient 07/21/20 1237     (approximate)  I have reviewed the triage vital signs and the nursing notes.   HISTORY  Chief Complaint Abdominal Pain and Diarrhea    HPI Tammy Washington is a 30 y.o. female with migraines and irritable bowel syndrome who comes in for abdominal pain.  Patient reports having some intermittent episodes of diarrhea for the past 2 weeks.  States that she usually goes 4-5 times daily, slightly improved with Imodium but only uses it every 2 to 3 days, worse with trying to eat, moderate.  She states that she has been taking Zofran to help with nausea with little bit of improvement.  No vomiting.  At this time she reports that the abdominal pain is more in her upper abdomen.  She reports a little epigastric tenderness.  She has no lower abdominal tenderness.  Patient is currently menstruating.          Past Medical History:  Diagnosis Date  . Allergy   . H/O colonoscopy   . Migraine headache     Patient Active Problem List   Diagnosis Date Noted  . Irritable bowel syndrome with both constipation and diarrhea 04/27/2020  . Anal fissure 04/27/2020  . Urinary tract infection without hematuria 01/18/2020  . Diarrhea 06/17/2019  . GAD (generalized anxiety disorder) 06/17/2019  . Moderate episode of recurrent major depressive disorder ([place]) 06/17/2019  . Allergic rhinitis 11/19/2017  . 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 Migraines 11/19/2017  . Family history of polyps in the colon 11/19/2017  . Contraception 11/19/2017    Past Surgical History:  Procedure Laterality Date  . COLONOSCOPY WITH PROPOFOL N/A 12/12/2017   Procedure: COLONOSCOPY WITH PROPOFOL;  Surgeon: Lin Landsman, MD;  Location: [place];  Service: Endoscopy;  Laterality: N/A;  .  NO PAST SURGERIES      Prior to Admission medications   Medication Sig Start Date End Date Taking? Authorizing Provider  DULoxetine (CYMBALTA) 30 MG capsule TAKE 1 CAPSULE BY MOUTH EVERY DAY Patient not taking: Reported on 06/24/2020 05/20/20   Virginia Crews, MD  hydrOXYzine (ATARAX/VISTARIL) 25 MG tablet Take 25 mg by mouth 2 (two) times daily. Patient not taking: Reported on 06/24/2020 09/25/19   [provider]  lidocaine (XYLOCAINE) 5 % ointment Apply 1 application topically as needed. Patient not taking: Reported on 06/24/2020 04/27/20   Virginia Crews, MD  Nitroglycerin 0.4 % OINT Apply topically to anal fissure twice daily Patient not taking: Reported on 06/24/2020 04/27/20   Virginia Crews, MD  ondansetron (ZOFRAN) 4 MG tablet Take 1 tablet (4 mg total) by mouth every 8 (eight) hours as needed. Patient not taking: Reported on 06/24/2020 01/26/20   Mar Daring, PA-C  SUMAtriptan (IMITREX) 50 MG tablet Take 1 tablet (50 mg total) by mouth every 2 (two) hours as needed for migraine. May repeat in 2 hours if headache persists or recurs. Patient not taking: Reported on 06/24/2020 11/19/17   Virginia Crews, MD    Allergies Patient has no known allergies.  Family History  Problem Relation Age of Onset  . Healthy Mother   . Rheum arthritis Father   . Colon polyps Sister 31       serrated adenoma  . Dementia Maternal Grandmother   . Healthy Sister   . Colon cancer Neg Hx   .  Breast cancer Neg Hx   . Ovarian cancer Neg Hx   . Cervical cancer Neg Hx     Social History Social History   Tobacco Use  . Smoking status: Former Smoker    Years: 3.00    Types: Cigarettes    Quit date: 04/24/2015    Years since quitting: 5.2  . Smokeless tobacco: Never Used  . Tobacco comment: former social smoker  Vaping Use  . Vaping Use: Never used  Substance Use Topics  . Alcohol use: Yes    Alcohol/week: 1.0 - 2.0 standard drink    Types: 1 - 2 Cans of beer per week   . Drug use: Never      Review of Systems Constitutional: No fever/chills Eyes: No visual changes. ENT: No sore throat. Cardiovascular: Denies chest pain. Respiratory: Denies shortness of breath. Gastrointestinal: + abd pain, n,v, diarrhea  Genitourinary: Negative for dysuria. Musculoskeletal: Negative for back pain. Skin: Negative for rash. Neurological: Negative for headaches, focal weakness or numbness. All other ROS negative ____________________________________________   PHYSICAL EXAM:  VITAL SIGNS: ED Triage Vitals  Enc Vitals Group     BP 07/21/20 1129 (!) 146/107     Pulse Rate 07/21/20 1129 97     Resp 07/21/20 1129 18     Temp 07/21/20 1129 98.4 F (36.9 C)     Temp Source 07/21/20 1129 Oral     SpO2 07/21/20 1129 99 %     Weight 07/21/20 1130 228 lb (103.4 kg)     Height 07/21/20 1130 6' (1.829 m)     Head Circumference --      Peak Flow --      Pain Score 07/21/20 1129 4     Pain Loc --      Pain Edu? --      Excl. in [place]? --     Constitutional: Alert and oriented. Well appearing and in no acute distress. Eyes: Conjunctivae are normal. EOMI. Head: Atraumatic. Nose: No congestion/rhinnorhea. Mouth/Throat: Mucous membranes are moist.   Neck: No stridor. Trachea Midline. FROM Cardiovascular: Normal rate, regular rhythm. Grossly normal heart sounds.  Good peripheral circulation. Respiratory: Normal respiratory effort.  No retractions. Lungs CTAB. Gastrointestinal: Soft and nontender. Mild epigastric tenderness No distention. No abdominal bruits.  Musculoskeletal: No lower extremity tenderness nor edema.  No joint effusions. Neurologic:  Normal speech and language. No gross focal neurologic deficits are appreciated.  Skin:  Skin is warm, dry and intact. No rash noted. Psychiatric: Mood and affect are normal. Speech and behavior are normal. GU: Deferred   ____________________________________________   LABS (all labs ordered are listed, but only  abnormal results are displayed)  Labs Reviewed  COMPREHENSIVE METABOLIC PANEL - Abnormal; Notable for the following components:      Result Value   Glucose, Bld 104 (*)    Total Protein 8.3 (*)    All other components within normal limits  LIPASE, BLOOD  CBC  URINALYSIS, COMPLETE (UACMP) WITH MICROSCOPIC  POC URINE PREG, ED   ____________________________________________  RADIOLOGY   Official radiology report(s): US ABDOMEN LIMITED RUQ (LIVER/GB)  Result Date: 07/21/2020 CLINICAL DATA:  Epigastric pain EXAM: ULTRASOUND ABDOMEN LIMITED RIGHT UPPER QUADRANT COMPARISON:  None. FINDINGS: Gallbladder: No gallstones or wall thickening visualized. No sonographic Murphy sign noted by sonographer. Common bile duct: Diameter: 3.6 mm Liver: No focal lesion identified. Within normal limits in parenchymal echogenicity. Portal vein is patent on color Doppler imaging with normal direction of blood flow towards the liver. Other:  None. IMPRESSION: Normal examination Electronically Signed   By: Prudencio Pair M.D.   On: 07/21/2020 14:14    ____________________________________________   PROCEDURES  Procedure(s) performed (including Critical Care):  Procedures   ____________________________________________   INITIAL IMPRESSION / ASSESSMENT AND PLAN / ED COURSE  KAMIE KORBER was evaluated in Emergency Department on 07/21/2020 for the symptoms described in the history of present illness. She was evaluated in the context of the global COVID-19 pandemic, which necessitated consideration that the patient might be at risk for infection with the SARS-CoV-2 virus that causes COVID-19. Institutional protocols and algorithms that pertain to the evaluation of patients at risk for COVID-19 are in a state of rapid change based on information released by regulatory bodies including the CDC and federal and state organizations. These policies and algorithms were followed during the patient's care in the ED.     Patient is a well-appearing 30 year old with normal vital signs other than hypertension who comes in with abdominal pain, diarrhea nausea.  Patient is being followed up with GI.  I suspect this is most likely irritable bowel syndrome.  Her abdomen is soft and nontender in the lower abdomen so low suspicion for appendicitis, diverticulitis.  She does have a little bit of epigastric tenderness and will get ultrasound to rule out gallbladder pathology.  Labs are ordered to evaluate for pregnancy, electrolyte abnormalities.  Denies any recent travel.  Patient is unable to give a stool sample here.  Patient is currently asymptomatic so is okay with holding off on pain medicine at this time  Labs are reassuring, ultrasound is negative therefore will discharge patient home and patient will follow up with her GI doctor.  We discussed return precautions in regards to appendicitis but this time on my reassessment her abdomen remained soft and nontender I have very low suspicion.  Encourage patient to follow-up with PCP for blood pressure check       ____________________________________________   FINAL CLINICAL IMPRESSION(S) / ED DIAGNOSES   Final diagnoses:  Epigastric abdominal pain  Irritable bowel syndrome, unspecified type      MEDICATIONS GIVEN DURING THIS VISIT:  Medications - No data to display   ED Discharge Orders         Ordered    dicyclomine (BENTYL) 10 MG capsule  4 times daily        07/21/20 1455           Note:  This document was prepared using Dragon voice recognition software and may include unintentional dictation errors.   Vanessa Navarro, MD 07/21/20 [phone]

## 2020-07-26 ENCOUNTER — Other Ambulatory Visit: Payer: Self-pay

## 2020-07-26 ENCOUNTER — Encounter: Payer: Self-pay | Admitting: Family Medicine

## 2020-07-26 ENCOUNTER — Ambulatory Visit (INDEPENDENT_AMBULATORY_CARE_PROVIDER_SITE_OTHER): Payer: BC Managed Care – PPO | Admitting: Family Medicine

## 2020-07-26 VITALS — Temp 98.2°F | Resp 16 | Ht 72.0 in | Wt 232.0 lb

## 2020-07-26 DIAGNOSIS — R1030 Lower abdominal pain, unspecified: Secondary | ICD-10-CM

## 2020-07-26 DIAGNOSIS — R110 Nausea: Secondary | ICD-10-CM

## 2020-07-26 DIAGNOSIS — R197 Diarrhea, unspecified: Secondary | ICD-10-CM

## 2020-07-26 DIAGNOSIS — N926 Irregular menstruation, unspecified: Secondary | ICD-10-CM

## 2020-07-26 LAB — POCT URINALYSIS DIPSTICK
Bilirubin, UA: NEGATIVE
Glucose, UA: NEGATIVE
Ketones, UA: NEGATIVE
Leukocytes, UA: NEGATIVE
Nitrite, UA: NEGATIVE
Protein, UA: NEGATIVE
Spec Grav, UA: 1.01 (ref 1.010–1.025)
Urobilinogen, UA: 0.2 E.U./dL
pH, UA: 6.5 (ref 5.0–8.0)

## 2020-07-26 MED ORDER — ONDANSETRON HCL 4 MG PO TABS: 4.0000 mg | ORAL_TABLET | Freq: Three times a day (TID) | ORAL | 5 refills | Status: DC | PRN

## 2020-07-26 NOTE — Progress Notes (Signed)
Established patient visit   Patient: Tammy Washington   DOB: 1990/06/16   30 y.o. Female  MRN: [id number] Visit Date: 07/26/2020  Today's healthcare provider: Lavon Paganini, MD   Chief Complaint  Patient presents with  . Abdominal Pain   Subjective    Abdominal Pain This is a new problem. The current episode started 1 to 4 weeks ago (2 weeks). The problem occurs intermittently. The problem has been waxing and waning. The pain is located in the epigastric region. The quality of the pain is aching.    Patient also reports that she was seen in the ER on 07/21/2020 with similar symptoms. She reports that she was started on the Bentyl. She reports that this helped, but she still has ongoing symptoms.   Pain moves around tract of colon.  Havign change in BMs with more yellow, watery, fluffy, more malodorous.  GI profile, Pancreatic elastase and calprotectin ordered, but not done yet.   Social History   Tobacco Use  . Smoking status: Former Smoker    Years: 3.00    Types: Cigarettes    Quit date: 04/24/2015    Years since quitting: 5.2  . Smokeless tobacco: Never Used  . Tobacco comment: former social smoker  Vaping Use  . Vaping Use: Never used  Substance Use Topics  . Alcohol use: Yes    Alcohol/week: 1.0 - 2.0 standard drink    Types: 1 - 2 Cans of beer per week  . Drug use: Never     Medications: Outpatient Medications Prior to Visit  Medication Sig  . dicyclomine (BENTYL) 10 MG capsule Take 1 capsule (10 mg total) by mouth 4 (four) times daily for 14 days.  . [DISCONTINUED] ondansetron (ZOFRAN) 4 MG tablet Take 1 tablet (4 mg total) by mouth every 8 (eight) hours as needed.  . [DISCONTINUED] DULoxetine (CYMBALTA) 30 MG capsule TAKE 1 CAPSULE BY MOUTH EVERY DAY (Patient not taking: No sig reported)  . [DISCONTINUED] hydrOXYzine (ATARAX/VISTARIL) 25 MG tablet Take 25 mg by mouth 2 (two) times daily. (Patient not taking: No sig reported)  . [DISCONTINUED]  lidocaine (XYLOCAINE) 5 % ointment Apply 1 application topically as needed. (Patient not taking: No sig reported)  . [DISCONTINUED] Nitroglycerin 0.4 % OINT Apply topically to anal fissure twice daily (Patient not taking: No sig reported)  . [DISCONTINUED] SUMAtriptan (IMITREX) 50 MG tablet Take 1 tablet (50 mg total) by mouth every 2 (two) hours as needed for migraine. May repeat in 2 hours if headache persists or recurs. (Patient not taking: No sig reported)   No facility-administered medications prior to visit.    Review of Systems  Gastrointestinal: Positive for abdominal pain.       Objective    Temp 98.2 F (36.8 C)   Resp 16   Ht 6' (1.829 m)   Wt 232 lb (105.2 kg)   LMP 07/21/2020   BMI 31.46 kg/m     Physical Exam Vitals reviewed.  Constitutional:      General: She is not in acute distress.    Appearance: Normal appearance. She is well-developed. She is not diaphoretic.  HENT:     Head: Normocephalic and atraumatic.  Eyes:     General: No scleral icterus.    Conjunctiva/sclera: Conjunctivae normal.  Neck:     Thyroid: No thyromegaly.  Cardiovascular:     Rate and Rhythm: Normal rate and regular rhythm.     Pulses: Normal pulses.     Heart  sounds: Normal heart sounds. No murmur heard.   Pulmonary:     Effort: Pulmonary effort is normal. No respiratory distress.     Breath sounds: Normal breath sounds. No wheezing, rhonchi or rales.  Abdominal:     General: Bowel sounds are normal. There is no distension.     Palpations: Abdomen is soft.     Tenderness: There is abdominal tenderness in the right lower quadrant, suprapubic area and left lower quadrant.  Musculoskeletal:     Cervical back: Neck supple.     Right lower leg: No edema.     Left lower leg: No edema.  Lymphadenopathy:     Cervical: No cervical adenopathy.  Skin:    General: Skin is warm and dry.     Findings: No rash.  Neurological:     Mental Status: She is alert and oriented to person,  place, and time. Mental status is at baseline.  Psychiatric:        Mood and Affect: Mood normal.        Behavior: Behavior normal.       Results for orders placed or performed in visit on 07/26/20  POCT urinalysis dipstick  Result Value Ref Range   Color, UA yellow    Clarity, UA clear    Glucose, UA Negative Negative   Bilirubin, UA negative    Ketones, UA negative    Spec Grav, UA 1.010 1.010 - 1.025   Blood, UA non hemolyzed trace    pH, UA 6.5 5.0 - 8.0   Protein, UA Negative Negative   Urobilinogen, UA 0.2 0.2 or 1.0 E.U./dL   Nitrite, UA negative    Leukocytes, UA Negative Negative    Assessment & Plan     1. Diarrhea, unspecified type 2. Lower abdominal pain - longstanding issue that has worsened - needs to get stool studies and H pylori testing per GI - advised her to go to their office today to pick up testing supplies - UA benign at ED, except for rare bacteria - reassurance given, but recheck UA for peace of mind - advised that GI testing will lead to next steps, which per GI note, may include EGD/colonoscopy - POCT urinalysis dipstick - TSH  3. Menstrual changes - this and diarrhea could suggest thyroid issue - will check TSH - TSH  4. Nausea - continue Zofran prn - ondansetron (ZOFRAN) 4 MG tablet; Take 1 tablet (4 mg total) by mouth every 8 (eight) hours as needed.  Dispense: 20 tablet; Refill: 5   Return if symptoms worsen or fail to improve.      Total time spent on today's visit was greater than 30 minutes, including both face-to-face time and nonface-to-face time personally spent on review of chart (labs and imaging), discussing labs and goals, discussing further work-up, treatment options, referrals to specialist if needed, reviewing outside records of pertinent, answering patient's questions, and coordinating care.  I, Lavon Paganini, MD, have reviewed all documentation for this visit. The documentation on 07/26/20 for the exam, diagnosis,  procedures, and orders are all accurate and complete.   Azalynn Maxim, Dionne Bucy, MD, MPH [facility]

## 2020-07-28 ENCOUNTER — Ambulatory Visit: Payer: Self-pay | Admitting: *Deleted

## 2020-07-28 NOTE — Telephone Encounter (Signed)
Pt reports had watery stools x 2 weeks, placed on Bentyl. States "Went from watery to nothing." States now with constipation, "I can feel a hard ball right inside rectum." States straining very little as H/O hemorrhoids. States has taken Miralax, Benefiber, drinking lots of water,ineffective. Did pass gas last night, none this AM. Also reports mild abdominal pain this am. Advised suppository, given instructions. Advised to be very gentle, liberal lubrication; do not try enema at home at this point. Advised ED if suppository does not work, abdominal pain worsens, nausea with vomiting occur.  Reason for Disposition . [1] Rectal pain or fullness from fecal impaction (rectum full of stool) AND [2] has not tried SITZ bath, suppository or enema  Answer Assessment - Initial Assessment Questions 1. STOOL PATTERN OR FREQUENCY: "How often do you pass bowel movements (BMs)?"  (Normal range: tid to q 3 days)  "When was the last BM passed?"       From watery stools to nothing 2. STRAINING: "Do you have to strain to have a BM?"      A little 3. RECTAL PAIN: "Does your rectum hurt when the stool comes out?" If Yes, ask: "Do you have hemorrhoids? How bad is the pain?"  (Scale 1-10; or mild, moderate, severe)     Rectal pain, abdominal pain today, left upper sided 4. STOOL COMPOSITION: "Are the stools hard?"      Yes 5. BLOOD ON STOOLS: "Has there been any blood on the toilet tissue or on the surface of the BM?" If Yes, ask: "When was the last time?"      no 6. CHRONIC CONSTIPATION: "Is this a new problem for you?"  If no, ask: "How long have you had this problem?" (days, weeks, months)      IBS 7. CHANGES IN DIET OR HYDRATION: "Have there been any recent changes in your diet?" "How much fluids are you drinking consuming on a daily basis?"  "How much have you had to drink today?"     no 8. MEDICATIONS: "Have you been taking any new medications?" "Are you taking any narcotic pain medications?" (e.g., Vicoden,  Percocet, morphine, dilaudid)    Bentyl 9. LAXATIVES: "Have you been using any stool softeners, laxatives, or enemas?"  If yes, ask "What, how often, and when was the last time?" 10.ACTIVITY:  "How much walking do you do every day? on a daily basis?"  "Has your activity level decreased in the past week?"        *No Answer* 11. CAUSE: "What do you think is causing the constipation?"        *No Answer* 12. OTHER SYMPTOMS: "Do you have any other symptoms?" (e.g., abdominal pain, bloating, fever, vomiting)       Abdominal pain 13. MEDICAL HISTORY: "Do you have a history of hemorrhoids, rectal fissures, or rectal surgery or rectal abscess?"         *No Answer*  Protocols used: CONSTIPATION-A-AH

## 2020-08-28 ENCOUNTER — Telehealth: Payer: BC Managed Care – PPO | Admitting: Nurse Practitioner

## 2020-08-28 ENCOUNTER — Encounter: Payer: Self-pay | Admitting: Nurse Practitioner

## 2020-08-28 DIAGNOSIS — N3000 Acute cystitis without hematuria: Secondary | ICD-10-CM

## 2020-08-28 MED ORDER — NITROFURANTOIN MONOHYD MACRO 100 MG PO CAPS: 100.0000 mg | ORAL_CAPSULE | Freq: Two times a day (BID) | ORAL | 0 refills | Status: DC

## 2020-08-28 NOTE — Patient Instructions (Signed)
Take medication as prescribe Cotton underwear Take shower not bath Cranberry juice, yogurt Force fluids AZO over the counter X2 days RTO prn   

## 2020-08-28 NOTE — Progress Notes (Signed)
Ms. Tammy, Washington are scheduled for a virtual visit with your provider today.    Just as we do with appointments in the office, we must obtain your consent to participate.  Your consent will be active for this visit and any virtual visit you may have with one of our providers in the next 365 days.    If you have a MyChart account, I can also send a copy of this consent to you electronically.  All virtual visits are billed to your insurance company just like a traditional visit in the office.  As this is a virtual visit, video technology does not allow for your provider to perform a traditional examination.  This may limit your provider's ability to fully assess your condition.  If your provider identifies any concerns that need to be evaluated in person or the need to arrange testing such as labs, EKG, etc, we will make arrangements to do so.    Although advances in technology are sophisticated, we cannot ensure that it will always work on either your end or our end.  If the connection with a video visit is poor, we may have to switch to a telephone visit.  With either a video or telephone visit, we are not always able to ensure that we have a secure connection.   I need to obtain your verbal consent now.   Are you willing to proceed with your visit today?   Tammy Washington has provided verbal consent on 08/28/2020 for a virtual visit (video or telephone).  Virtual Visit via Video  I connected with  Tammy Washington  on 08/28/20 at 12:59 by video and verified that I am speaking with the correct person using two identifiers. Tammy Washington is currently located at home and no one is currently with her during visit. The provider, Mary-Margaret Hassell Done, FNP is located at home at time of visit.  I discussed the limitations, risks, security and privacy concerns of performing an evaluation and management service by video  and the availability of in person appointments. I also discussed with the patient that  there may be a patient responsible charge related to this service. The patient expressed understanding and agreed to proceed.   Subjective:   HPI:   Patient is c/o urinary frequency and urgency. Slight dysuria today.  Review of Systems  Constitutional: Negative for chills and fever.  Respiratory: Negative.   Cardiovascular: Negative.   Genitourinary: Positive for dysuria, frequency and urgency. Negative for flank pain and hematuria.  Neurological: Negative.   Psychiatric/Behavioral: Negative.        See pertinent positives and negatives per HPI.  Patient Active Problem List   Diagnosis Date Noted  . Irritable bowel syndrome with both constipation and diarrhea 04/27/2020  . Anal fissure 04/27/2020  . Diarrhea 06/17/2019  . Allergic rhinitis 11/19/2017  . 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 Migraines 11/19/2017  . Family history of polyps in the colon 11/19/2017    Social History   Tobacco Use  . Smoking status: Former Smoker    Years: 3.00    Types: Cigarettes    Quit date: 04/24/2015    Years since quitting: 5.3  . Smokeless tobacco: Never Used  . Tobacco comment: former social smoker  Substance Use Topics  . Alcohol use: Yes    Alcohol/week: 1.0 - 2.0 standard drink    Types: 1 - 2 Cans of beer per week    Current Outpatient Medications:  .  dicyclomine (  BENTYL) 10 MG capsule, Take 1 capsule (10 mg total) by mouth 4 (four) times daily for 14 days., Disp: 56 capsule, Rfl: 0 .  ondansetron (ZOFRAN) 4 MG tablet, Take 1 tablet (4 mg total) by mouth every 8 (eight) hours as needed., Disp: 20 tablet, Rfl: 5  No Known Allergies  Objective:   There were no vitals taken for this visit.  Patient is well-developed, well-nourished in no acute distress.  Resting comfortably at home.  Head is normocephalic, atraumatic.  No labored breathing.  Speech is clear and coherent with logical content.  Patient is alert and oriented at baseline.    Assessment and Plan:       Tammy Washington in today with chief complaint of No chief complaint on file.   1. Acute cystitis without hematuria Take medication as prescribe Cotton underwear Take shower not bath Cranberry juice, yogurt Force fluids AZO over the counter X2 days RTO prn  Meds ordered this encounter  Medications  . nitrofurantoin, macrocrystal-monohydrate, (MACROBID) 100 MG capsule    Sig: Take 1 capsule (100 mg total) by mouth 2 (two) times daily. 1 po BId    Dispense:  10 capsule    Refill:  0    Order Specific Question:   Supervising Provider    Answer:   Noemi Chapel [[id number]]      The above assessment and management plan was discussed with the patient. The patient verbalized understanding of and has agreed to the management plan. Patient is aware to call the clinic if symptoms persist or worsen. Patient is aware when to return to the clinic for a follow-up visit. Patient educated on when it is appropriate to go to the emergency department.   Mary-Margaret Hassell Done, [place], [place] 08/28/2020  Time spent with the patient: 8 minutes, of which >50% was spent in obtaining information about symptoms, reviewing previous labs, evaluations, and treatments, counseling about condition (please see the discussed topics above), and developing a plan to further investigate it; had a number of questions which I addressed.

## 2020-09-22 DIAGNOSIS — U071 COVID-19: Secondary | ICD-10-CM

## 2020-09-22 HISTORY — DX: COVID-19: U07.1

## 2020-09-29 ENCOUNTER — Encounter: Payer: Self-pay | Admitting: *Deleted

## 2020-09-29 ENCOUNTER — Ambulatory Visit: Payer: Self-pay | Admitting: Gastroenterology

## 2020-10-20 ENCOUNTER — Ambulatory Visit: Payer: Self-pay | Admitting: *Deleted

## 2020-10-20 NOTE — Telephone Encounter (Signed)
Please advise 

## 2020-10-20 NOTE — Telephone Encounter (Signed)
I returned pt's call.   She was positive on her home Covid test on 10/15/2020.   On day 3 she started having diarrhea and nausea.   The diarrhea is getting better but the nausea is non stop.   She's taken Zofran, Pepto Bismol but she's not getting relief.   Having dry heaves at times. She is requesting something for the non stop nausea.   She can't sleep or lay down due to it.    She has never taken Phenergan as far as she knows when I asked her since the Zofran is not working.   Usually the Zofran works but since having Covid causing my nausea it's not helping.  She was agreeable to being called back with Dr. Nancy Nordmann recommendation.    [phone].

## 2020-10-20 NOTE — Telephone Encounter (Signed)
Answer Assessment - Initial Assessment Questions 1. DIARRHEA SEVERITY: "How bad is the diarrhea?" "How many more stools have you had in the past 24 hours than normal?"    - NO DIARRHEA (SCALE 0)   - MILD (SCALE 1-3): Few loose or mushy BMs; increase of 1-3 stools over normal daily number of stools; mild increase in ostomy output.   -  MODERATE (SCALE 4-7): Increase of 4-6 stools daily over normal; moderate increase in ostomy output. * SEVERE (SCALE 8-10; OR 'WORST POSSIBLE'): Increase of 7 or more stools daily over normal; moderate increase in ostomy output; incontinence.     I'm having diarrhea but not bad now.   I have Covid.   I had joint pain and coughing.   Day 3 started with nausea and then diarrhea.   I'm having dry heaves.   I'm taking Zofran without relief.   Used Pepto Bismol.    I can't sleep or lay down.    Diarrhea is not bad like usual it's getting better but the nausea is awful. Just the constant nausea.    My pee is clear.   I'm drinking a lot of fluids. Nausea is not going away.    2. ONSET: "When did the diarrhea begin?"      *No Answer* 3. BM CONSISTENCY: "How loose or watery is the diarrhea?"      *No Answer* 4. VOMITING: "Are you also vomiting?" If Yes, ask: "How many times in the past 24 hours?"      *No Answer* 5. ABDOMINAL PAIN: "Are you having any abdominal pain?" If Yes, ask: "What does it feel like?" (e.g., crampy, dull, intermittent, constant)      *No Answer* 6. ABDOMINAL PAIN SEVERITY: If present, ask: "How bad is the pain?"  (e.g., Scale 1-10; mild, moderate, or severe)   - MILD (1-3): doesn't interfere with normal activities, abdomen soft and not tender to touch    - MODERATE (4-7): interferes with normal activities or awakens from sleep, abdomen tender to touch    - SEVERE (8-10): excruciating pain, doubled over, unable to do any normal activities       *No Answer* 7. ORAL INTAKE: If vomiting, "Have you been able to drink liquids?" "How much liquids have you had  in the past 24 hours?"     *No Answer* 8. HYDRATION: "Any signs of dehydration?" (e.g., dry mouth [not just dry lips], too weak to stand, dizziness, new weight loss) "When did you last urinate?"     *No Answer* 9. EXPOSURE: "Have you traveled to a foreign country recently?" "Have you been exposed to anyone with diarrhea?" "Could you have eaten any food that was spoiled?"     *No Answer* 10. ANTIBIOTIC USE: "Are you taking antibiotics now or have you taken antibiotics in the past 2 months?"       *No Answer* 11. OTHER SYMPTOMS: "Do you have any other symptoms?" (e.g., fever, blood in stool)       *No Answer* 12. PREGNANCY: "Is there any chance you are pregnant?" "When was your last menstrual period?"       *No Answer*  Protocols used: [facility]

## 2020-10-20 NOTE — Telephone Encounter (Signed)
Ok to send in phenergan 12.5 mg tid prn #20 r0

## 2020-10-21 ENCOUNTER — Encounter: Payer: Self-pay | Admitting: Emergency Medicine

## 2020-10-21 ENCOUNTER — Ambulatory Visit
Admission: EM | Admit: 2020-10-21 | Discharge: 2020-10-21 | Disposition: A | Discharge status: Home / Self Care | Payer: BC Managed Care – PPO | Attending: Emergency Medicine | Admitting: Emergency Medicine

## 2020-10-21 ENCOUNTER — Other Ambulatory Visit: Payer: Self-pay

## 2020-10-21 DIAGNOSIS — R112 Nausea with vomiting, unspecified: Secondary | ICD-10-CM | POA: Diagnosis not present

## 2020-10-21 DIAGNOSIS — U071 COVID-19: Secondary | ICD-10-CM

## 2020-10-21 MED ORDER — PROMETHAZINE HCL 25 MG PO TABS: 25.0000 mg | ORAL_TABLET | Freq: Four times a day (QID) | ORAL | 0 refills | Status: DC | PRN

## 2020-10-21 NOTE — ED Provider Notes (Signed)
Tammy Washington    CSN: [id number] Arrival date & time: 10/21/20  4332      History   Chief Complaint Chief Complaint  Patient presents with   Otalgia   Facial Pain   Nausea   Nasal Congestion     HPI Tammy Washington is a 30 y.o. female.  Patient tested COVID positive at home on 10/15/2020; symptom onset 10/14/2020.  She reports nausea, vomiting, diarrhea, congestion, sore throat.  Nausea is her worst symptom.  1 episode of emesis today; 1 episode of emesis yesterday.  No diarrhea today; 5 episodes yesterday.  She denies fever, chills, rash, cough, shortness of breath, or other symptoms.  Treatment at home with Zofran and Pepto-Bismol without relief.  Her PCP okayed a prescription for Phenergan yesterday but it has not been sent to the pharmacy yet.  Her medical history includes IBS, diarrhea, migraine headaches, seasonal allergies.  The history is provided by the patient and medical records.   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      Home Medications    Prior to Admission medications   Medication Sig Start Date End Date Taking? Authorizing Provider  promethazine (PHENERGAN) 25 MG tablet Take 1 tablet (25 mg total) by mouth every 6 (six) hours  as needed for nausea or vomiting. 10/21/20  Yes Sharion Balloon, NP  dicyclomine (BENTYL) 10 MG capsule Take 1 capsule (10 mg total) by mouth 4 (four) times daily for 14 days. 07/21/20 08/04/20  Vanessa [place], MD  nitrofurantoin, macrocrystal-monohydrate, (MACROBID) 100 MG capsule Take 1 capsule (100 mg total) by mouth 2 (two) times daily. 1 po BId 08/28/20   Hassell Done, Mary-Margaret, FNP  ondansetron (ZOFRAN) 4 MG tablet Take 1 tablet (4 mg total) by mouth every 8 (eight) hours as needed. 07/26/20   Bacigalupo, Dionne Bucy, MD    Family History Family History  Problem Relation Age of Onset   Healthy Mother    Rheum arthritis Father    Colon polyps Sister 67       serrated adenoma   Dementia Maternal Grandmother    Healthy Sister    Colon cancer Neg Hx    Breast cancer Neg Hx    Ovarian cancer Neg Hx    Cervical cancer Neg Hx     Social History Social History   Tobacco Use   Smoking status: Former    Years: 3.00    Pack years: 0.00    Types: Cigarettes    Quit date: 04/24/2015    Years since quitting: 5.4   Smokeless tobacco: Never   Tobacco comments:    former social smoker  Vaping Use   Vaping Use: Never used  Substance Use  Topics   Alcohol use: Yes    Alcohol/week: 1.0 - 2.0 standard drink    Types: 1 - 2 Cans of beer per week   Drug use: Never     Allergies   Patient has no known allergies.   Review of Systems Review of Systems  Constitutional:  Negative for chills and fever.  HENT:  Positive for congestion and sore throat. Negative for ear pain.   Respiratory:  Negative for cough and shortness of breath.   Cardiovascular:  Negative for chest pain and palpitations.  Gastrointestinal:  Positive for diarrhea, nausea and vomiting. Negative for abdominal pain.  Skin:  Negative for color change and rash.  All other systems reviewed and are negative.   Physical Exam Triage Vital Signs ED Triage Vitals  Enc Vitals Group     BP      Pulse      Resp      Temp      Temp  src      SpO2      Weight      Height      Head Circumference      Peak Flow      Pain Score      Pain Loc      Pain Edu?      Excl. in [place]?    No data found.  Updated Vital Signs BP 124/88 (BP Location: Left Arm)   Pulse 66   Temp 98.2 F (36.8 C) (Oral)   Resp 18   LMP 09/30/2020 (Approximate)   SpO2 96%   Visual Acuity Right Eye Distance:   Left Eye Distance:   Bilateral Distance:    Right Eye Near:   Left Eye Near:    Bilateral Near:     Physical Exam Vitals and nursing note reviewed.  Constitutional:      General: She is not in acute distress.    Appearance: She is well-developed.  HENT:     Head: Normocephalic and atraumatic.     Right Ear: Tympanic membrane normal.     Left Ear: Tympanic membrane normal.     Nose: Nose normal.     Mouth/Throat:     Mouth: Mucous membranes are moist.     Pharynx: Oropharynx is clear.  Eyes:     Conjunctiva/sclera: Conjunctivae normal.  Cardiovascular:     Rate and Rhythm: Normal rate and regular rhythm.     Heart sounds: Normal heart sounds.  Pulmonary:     Effort: Pulmonary effort is normal. No respiratory distress.     Breath sounds: Normal breath sounds.  Abdominal:     General: Bowel sounds are normal.     Palpations: Abdomen is soft.     Tenderness: There is no abdominal tenderness. There is no guarding or rebound.  Musculoskeletal:     Cervical back: Neck supple.  Skin:    General: Skin is warm and dry.  Neurological:     General: No focal deficit present.     Mental Status: She is alert and oriented to person, place, and time.     Gait: Gait normal.  Psychiatric:        Mood and Affect: Mood normal.        Behavior: Behavior normal.     UC Treatments / Results  Labs (all labs ordered are listed, but only abnormal results are displayed) Labs Reviewed - No data to display  EKG   Radiology No results found.  Procedures Procedures (  including critical care time)  Medications Ordered in  UC Medications - No data to display  Initial Impression / Assessment and Plan / UC Course  I have reviewed the triage vital signs and the nursing notes.  Pertinent labs & imaging results that were available during my care of the patient were reviewed by me and considered in my medical decision making (see chart for details).  COVID-19.  Nausea with vomiting.  Patient has attempted treatment at home with Zofran and OTC medication without relief.  She appears well-hydrated and states she is able to keep her self hydrated at home.  Treating with Phenergan; precautions for drowsiness with this medication discussed.  Instructed patient to keep her self hydrated with clear liquids.  ED precautions discussed.  Instructed her to follow-up with her PCP as needed.  She agrees to plan of care.   Final Clinical Impressions(s) / UC Diagnoses   Final diagnoses:  COVID-19  Non-intractable vomiting with nausea, unspecified vomiting type     Discharge Instructions      Take the antinausea medication as directed.  This medication can cause drowsiness so do not drive, operate machinery, or drink alcohol with it.  Keep yourself hydrated with clear liquids, such as water, Gatorade, Pedialyte, Sprite, or ginger ale.    Go to the emergency department if you have acute worsening symptoms.    Follow up with your primary care provider if your symptoms are not improving.          ED Prescriptions     Medication Sig Dispense Auth. Provider   promethazine (PHENERGAN) 25 MG tablet Take 1 tablet (25 mg total) by mouth every 6 (six) hours as needed for nausea or vomiting. 30 tablet Sharion Balloon, NP      PDMP not reviewed this encounter.   Sharion Balloon, NP 10/21/20 0900

## 2020-10-21 NOTE — Telephone Encounter (Signed)
Patient went to urgent care and they also Rx phenergan. Rx is not needed.

## 2020-10-21 NOTE — ED Triage Notes (Signed)
Pt presents today with c/o of nausea, sinus pressure/congestion, sore throat and body aches. She tested positive for Covid approx 6 days ago. Nurse line suggested she come to get checked out.

## 2020-10-21 NOTE — Discharge Instructions (Addendum)
Take the antinausea medication as directed.  This medication can cause drowsiness so do not drive, operate machinery, or drink alcohol with it.  Keep yourself hydrated with clear liquids, such as water, Gatorade, Pedialyte, Sprite, or ginger ale.    Go to the emergency department if you have acute worsening symptoms.    Follow up with your primary care provider if your symptoms are not improving.

## 2021-03-14 ENCOUNTER — Ambulatory Visit
Admission: EM | Admit: 2021-03-14 | Discharge: 2021-03-14 | Disposition: A | Discharge status: Home / Self Care | Payer: BC Managed Care – PPO | Attending: Family Medicine | Admitting: Family Medicine

## 2021-03-14 ENCOUNTER — Encounter: Payer: Self-pay | Admitting: Emergency Medicine

## 2021-03-14 DIAGNOSIS — K30 Functional dyspepsia: Secondary | ICD-10-CM | POA: Diagnosis not present

## 2021-03-14 DIAGNOSIS — R1010 Upper abdominal pain, unspecified: Secondary | ICD-10-CM

## 2021-03-14 LAB — POCT URINALYSIS DIP (MANUAL ENTRY)
Bilirubin, UA: NEGATIVE
Blood, UA: NEGATIVE
Glucose, UA: NEGATIVE mg/dL
Ketones, POC UA: NEGATIVE mg/dL
Leukocytes, UA: NEGATIVE
Nitrite, UA: NEGATIVE
Protein Ur, POC: NEGATIVE mg/dL
Spec Grav, UA: <=1.005 — AB (ref 1.010–1.025)
Urobilinogen, UA: 0.2 E.U./dL
pH, UA: 5.5 (ref 5.0–8.0)

## 2021-03-14 LAB — POCT URINE PREGNANCY: Preg Test, Ur: NEGATIVE

## 2021-03-14 MED ORDER — FAMOTIDINE 20 MG PO TABS: ORAL_TABLET | ORAL | 0 refills | Status: DC

## 2021-03-14 NOTE — ED Provider Notes (Signed)
Tammy Washington    CSN: [id number] Arrival date & time: 03/14/21  6948      History   Chief Complaint Chief Complaint  Patient presents with   Abdominal Pain   Nausea    HPI Tammy Washington is a 30 y.o. female.   HPI Patient with a medical history of IBS, colitis, GAD presents today with abdominal pain x 1 day. Location of abdominal pain: generalized upper abdominal region with radiation to the RUQ and LUQ. Pain has been present for several months, over the last few days symptoms have worsened. Endorses nausea and sensation of lump in her throat . Seen GI once and was recommended to have colonoscopy. No actively taking any medication with exception of fiber daily to help regulate bowels.  At present pain has improved since earlier this morning. Endorses regular bowel habits.    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      Home Medications    Prior to Admission medications   Medication Sig Start Date End Date Taking? Authorizing Provider  famotidine (PEPCID) 20 MG tablet Start famotidine 40 mg, PO, twice daily for the next 7 days then take 20 mg as needed twice daily. 03/14/21  Yes Scot Jun, FNP  dicyclomine (BENTYL) 10 MG capsule Take  1 capsule (10 mg total) by mouth 4 (four) times daily for 14 days. 07/21/20 08/04/20  Vanessa [place], MD  nitrofurantoin, macrocrystal-monohydrate, (MACROBID) 100 MG capsule Take 1 capsule (100 mg total) by mouth 2 (two) times daily. 1 po BId 08/28/20   Hassell Done, Mary-Margaret, FNP  ondansetron (ZOFRAN) 4 MG tablet Take 1 tablet (4 mg total) by mouth every 8 (eight) hours as needed. 07/26/20   Virginia Crews, MD  promethazine (PHENERGAN) 25 MG tablet Take 1 tablet (25 mg total) by mouth every 6 (six) hours as needed for nausea or vomiting. 10/21/20   Sharion Balloon, NP    Family History Family History  Problem Relation Age of Onset   Healthy Mother    Rheum arthritis Father    Colon polyps Sister 71       serrated adenoma   Dementia Maternal Grandmother    Healthy Sister    Colon cancer Neg Hx    Breast cancer Neg Hx    Ovarian cancer Neg Hx    Cervical cancer Neg Hx     Social History Social History   Tobacco Use   Smoking status: Former    Years: 3.00    Types: Cigarettes    Quit date: 04/24/2015    Years since quitting: 5.8   Smokeless tobacco: Never   Tobacco comments:    former social smoker  Vaping Use  Vaping Use: Never used  Substance Use Topics   Alcohol use: Yes    Alcohol/week: 1.0 - 2.0 standard drink    Types: 1 - 2 Cans of beer per week   Drug use: Never     Allergies   Patient has no known allergies.   Review of Systems Review of Systems Pertinent negatives listed in HPI  Physical Exam Triage Vital Signs ED Triage Vitals  Enc Vitals Group     BP      Pulse      Resp      Temp      Temp src      SpO2      Weight      Height      Head Circumference      Peak Flow      Pain Score      Pain Loc      Pain Edu?      Excl. in [place]?    No data found.  Updated Vital Signs BP 128/87 (BP Location: Left Arm)   Pulse 72   Temp 98.9 F (37.2 C) (Oral)   Resp 16   SpO2 98%   Visual Acuity Right Eye Distance:   Left Eye Distance:   Bilateral  Distance:    Right Eye Near:   Left Eye Near:    Bilateral Near:     Physical Exam Constitutional:      General: She is not in acute distress.    Appearance: She is well-developed. She is not ill-appearing.  HENT:     Head: Normocephalic.  Cardiovascular:     Rate and Rhythm: Normal rate and regular rhythm.  Pulmonary:     Effort: Pulmonary effort is normal.     Breath sounds: Normal breath sounds.  Abdominal:     General: Abdomen is scaphoid. Bowel sounds are increased.     Palpations: Abdomen is soft.     Tenderness: There is no abdominal tenderness.     Comments: No reproducible tenderness present with physical exam   Skin:    General: Skin is warm.     Capillary Refill: Capillary refill takes less than 2 seconds.  Neurological:     Mental Status: She is alert.  Psychiatric:        Attention and Perception: Attention and perception normal.     UC Treatments / Results  Labs (all labs ordered are listed, but only abnormal results are displayed) Labs Reviewed  POCT URINALYSIS DIP (MANUAL ENTRY) - Abnormal; Notable for the following components:      Result Value   Spec Grav, UA <=1.005 (*)    All other components within normal limits  POCT URINE PREGNANCY    EKG   Radiology No results found.  Procedures Procedures (including critical care time)  Medications Ordered in UC Medications - No data to display  Initial Impression / Assessment and Plan / UC Course  I have reviewed the triage vital signs and the nursing notes.  Pertinent labs & imaging results that were available during my care of the patient were reviewed by me and considered in my medical decision making (see chart for details).    Physical exam is reassuring.  Given hyperactive bowel sounds suspect abdominal symptoms are related to acid indigestion and a combination of chronic IBS.  Patient did not tolerate Bentyl in the past therefore will not represcribe.  We will trial a course of high-dose  Pepcid to reduce abdominal acid and  bloating.  Advised to follow-up with GI doctor.  Discussed red flag symptoms that warrant immediate evaluation in setting of the ED.  Patient verbalized understanding agreement with plan. Final Clinical Impressions(s) / UC Diagnoses   Final diagnoses:  Pain of upper abdomen  Acid indigestion     Discharge Instructions      Follow-up with  gastroenterologist for further work-up and management of recurrent symptoms.  If your abdominal pain worsens in severity go immediately to the emergency department for further evaluation of current symptoms.  Take medication as directed.     ED Prescriptions     Medication Sig Dispense Auth. Provider   famotidine (PEPCID) 20 MG tablet Start famotidine 40 mg, PO, twice daily for the next 7 days then take 20 mg as needed twice daily. 60 tablet Scot Jun, FNP      PDMP not reviewed this encounter.   Scot Jun, [place] 03/14/21 [phone]

## 2021-03-14 NOTE — ED Triage Notes (Signed)
Pt has had burning, sharp and radiating pain in epigastric region and feels like a lump is in her throat. Pt has IBS hx so is careful what she eats, but has had this pain for several months. It has gotten acutely worse and is now radiating to RUQ and LUQ. Nausea present often.

## 2021-03-14 NOTE — Discharge Instructions (Addendum)
Follow-up with  gastroenterologist for further work-up and management of recurrent symptoms.  If your abdominal pain worsens in severity go immediately to the emergency department for further evaluation of current symptoms.  Take medication as directed.

## 2021-03-25 ENCOUNTER — Telehealth: Payer: Self-pay | Admitting: Gastroenterology

## 2021-03-25 NOTE — Telephone Encounter (Signed)
Pt left a MYCHART  message about wanting to schedule an appt with Dr. Bonna Gains. I followed up with Patient, no answer, LVM

## 2021-04-11 ENCOUNTER — Ambulatory Visit: Payer: BC Managed Care – PPO | Admitting: Family Medicine

## 2021-04-11 ENCOUNTER — Ambulatory Visit: Payer: Self-pay

## 2021-04-11 NOTE — Telephone Encounter (Signed)
°  Chief Complaint: upper abdominal pain, reflux Symptoms: increased burping, flatus, heartburn and reflux Frequency: constant Pertinent Negatives: Patient denies back pain, diarrhea, fever, vomiting Disposition: [] ED /[] Urgent Care (no appt availability in office) / [x] Appointment(In office/virtual)/ []  [facility]/ [] Home Care/ [] Refused Recommended Disposition  Additional Notes: pt wanting h. Pylori breath test      Reason for Disposition  [1] MODERATE pain (e.g., interferes with normal activities) AND [2] pain comes and goes (cramps) AND [3] present > 24 hours  (Exception: pain with Vomiting or Diarrhea - see that Guideline)  Answer Assessment - Initial Assessment Questions 1. LOCATION: "Where does it hurt?"      Upper abd  2. RADIATION: "Does the pain shoot anywhere else?" (e.g., chest, back)     no 3. ONSET: "When did the pain begin?" (e.g., minutes, hours or days ago)      21mo 4. SUDDEN: "Gradual or sudden onset?"     gradual 5. PATTERN "Does the pain come and go, or is it constant?"    - If constant: "Is it getting better, staying the same, or worsening?"      (Note: Constant means the pain never goes away completely; most serious pain is constant and it progresses)     - If intermittent: "How long does it last?" "Do you have pain now?"     (Note: Intermittent means the pain goes away completely between bouts)     constant 6. SEVERITY: "How bad is the pain?"  (e.g., Scale 1-10; mild, moderate, or severe)   - MILD (1-3): doesn't interfere with normal activities, abdomen soft and not tender to touch    - MODERATE (4-7): interferes with normal activities or awakens from sleep, abdomen tender to touch    - SEVERE (8-10): excruciating pain, doubled over, unable to do any normal activities      Mild and intermittently to moderate 7. RECURRENT SYMPTOM: "Have you ever had this type of stomach pain before?" If Yes, ask: "When was the last time?" and "What happened that  time?"      no 8. CAUSE: "What do you think is causing the stomach pain?"     ulcer 9. RELIEVING/AGGRAVATING FACTORS: "What makes it better or worse?" (e.g., movement, antacids, bowel movement)     Ibuprofen Pepcid 10. OTHER SYMPTOMS: "Do you have any other symptoms?" (e.g., back pain, diarrhea, fever, urination pain, vomiting)       Increased burping and flatus, heartburn and reflux 11. PREGNANCY: "Is there any chance you are pregnant?" "When was your last menstrual period?"  Protocols used: Abdominal Pain - [employer]

## 2021-04-12 ENCOUNTER — Encounter: Payer: Self-pay | Admitting: Physician Assistant

## 2021-04-12 ENCOUNTER — Ambulatory Visit: Payer: BC Managed Care – PPO | Admitting: Physician Assistant

## 2021-04-12 ENCOUNTER — Other Ambulatory Visit: Payer: Self-pay

## 2021-04-12 VITALS — BP 125/87 | HR 77 | Ht 72.0 in | Wt 214.0 lb

## 2021-04-12 DIAGNOSIS — R5383 Other fatigue: Secondary | ICD-10-CM

## 2021-04-12 DIAGNOSIS — L659 Nonscarring hair loss, unspecified: Secondary | ICD-10-CM

## 2021-04-12 DIAGNOSIS — K5900 Constipation, unspecified: Secondary | ICD-10-CM

## 2021-04-12 DIAGNOSIS — R0981 Nasal congestion: Secondary | ICD-10-CM

## 2021-04-12 DIAGNOSIS — R1013 Epigastric pain: Secondary | ICD-10-CM

## 2021-04-12 DIAGNOSIS — R739 Hyperglycemia, unspecified: Secondary | ICD-10-CM

## 2021-04-12 DIAGNOSIS — R110 Nausea: Secondary | ICD-10-CM | POA: Diagnosis not present

## 2021-04-12 DIAGNOSIS — K219 Gastro-esophageal reflux disease without esophagitis: Secondary | ICD-10-CM | POA: Diagnosis not present

## 2021-04-12 DIAGNOSIS — Z114 Encounter for screening for human immunodeficiency virus [HIV]: Secondary | ICD-10-CM

## 2021-04-12 DIAGNOSIS — Z1159 Encounter for screening for other viral diseases: Secondary | ICD-10-CM

## 2021-04-12 DIAGNOSIS — Z8349 Family history of other endocrine, nutritional and metabolic diseases: Secondary | ICD-10-CM

## 2021-04-12 MED ORDER — OMEPRAZOLE 20 MG PO CPDR
20.0000 mg | DELAYED_RELEASE_CAPSULE | Freq: Every day | ORAL | 3 refills | Status: DC
Start: 2021-04-12 — End: 2021-07-08

## 2021-04-12 MED ORDER — AZELASTINE HCL 0.1 % NA SOLN: 2.0000 | Freq: Two times a day (BID) | NASAL | 12 refills | Status: DC

## 2021-04-12 MED ORDER — FLUTICASONE PROPIONATE 50 MCG/ACT NA SUSP: 2.0000 | Freq: Every day | NASAL | 6 refills | Status: DC

## 2021-04-12 NOTE — Progress Notes (Signed)
Established patient visit   Patient: Tammy Washington   DOB: 1990/07/10   30 y.o. Female  MRN: [id number] Visit Date: 04/12/2021  Today's healthcare provider: Mikey Kirschner, PA-C   Cc. Abdominal pain x 4 months  Subjective    HPI  Tammy Washington is a 30 y/o female who presents today with epigastric/RUQ pain that used to be intermittent but it now conistent that she describes as a stabbing pain at its worst, and a burning sensation at its best. She has increased reflux, burping, heartburn. She took Prilosec 20 mg for a few weeks and then 10 mg daily but still experiences symptoms. She reports she finds with most improvement w/ tums and ibuprofen. Reports symptoms are worse after meals, no difference between fatty/fried food and other foods. Reports negative pregnancy test in UC. 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 Denies blood in stool.  She also reports nausea w/ yellow mucous in the mornings, she believes this is from post nasal drip and usually is relieved after a few hours of being upright.   She also reports overall increased fatigue, hair loss. She has family history in her father and sister of hashimoto's thyroiditis and has questions about being tested for it today.   Medications: Outpatient Medications Prior to Visit  Medication Sig   famotidine (PEPCID) 20 MG tablet Start famotidine 40 mg, PO, twice daily for the next 7 days then take 20 mg as needed twice daily.   ondansetron (ZOFRAN) 4 MG tablet Take 1 tablet (4 mg total) by mouth every 8 (eight) hours as needed.   promethazine (PHENERGAN) 25 MG tablet Take 1 tablet (25 mg total) by mouth every 6 (six) hours as needed for nausea or vomiting.   [DISCONTINUED] dicyclomine (BENTYL) 10 MG capsule Take 1 capsule (10 mg total) by mouth 4 (four) times daily for 14 days.   [DISCONTINUED] nitrofurantoin, macrocrystal-monohydrate, (MACROBID) 100 MG capsule Take 1  capsule (100 mg total) by mouth 2 (two) times daily. 1 po BId   No facility-administered medications prior to visit.    Review of Systems  Constitutional:  Positive for fatigue. Negative for fever.  HENT:  Positive for congestion and postnasal drip.   Respiratory:  Negative for cough, chest tightness, shortness of breath and wheezing.   Cardiovascular:  Negative for chest pain and leg swelling.  Gastrointestinal:  Positive for abdominal pain, constipation and nausea. Negative for blood in stool and diarrhea.      Objective    Blood pressure 125/87, pulse 77, height 6' (1.829 m), weight 214 lb (97.1 kg), SpO2 100 %.   Physical Exam Constitutional:      General: She is awake.     Appearance: She is well-developed.  HENT:     Head: Normocephalic.  Eyes:     Conjunctiva/sclera: Conjunctivae normal.  Cardiovascular:     Rate and Rhythm: Normal rate and regular rhythm.     Heart sounds: Normal heart sounds.  Pulmonary:     Effort: Pulmonary effort is normal.     Breath sounds: Normal breath sounds.  Abdominal:     General: Bowel sounds are normal. There is no distension.     Palpations: Abdomen is soft.     Tenderness: There is abdominal tenderness in the right upper quadrant and epigastric area. There is no guarding. Negative signs include Murphy's sign.  Skin:    General: Skin is warm.  Neurological:  Mental Status: She is alert and oriented to person, place, and time.  Psychiatric:        Attention and Perception: Attention normal.        Mood and Affect: Mood normal.        Speech: Speech normal.        Behavior: Behavior is cooperative.     No results found for any visits on 04/12/21.  Assessment & Plan     Problem List Items Addressed This Visit       Digestive   Gastroesophageal reflux disease - Primary    ddx gastritis vs hpylori vs biliary colic Ordered CBC, hpylori breath test, CMP, lipase RUQ [place] ordered, if hyplori + can cancel ultrasound Advised she  start omeprazole 20 mg in AM before food can take TUMS for breakthrough pain Advised may need ref to GI for endoscopy       Relevant Medications   omeprazole (PRILOSEC) 20 MG capsule   Other Relevant Orders   H. pylori breath test   CBC     Other   Nausea   Relevant Medications   omeprazole (PRILOSEC) 20 MG capsule   Other Relevant Orders   Lipase   US Abdomen Limited RUQ (LIVER/GB)   Nasal congestion    PND and congestion rx flonase and azelastine can alternate AM and PM Advised humidifer      Relevant Medications   fluticasone (FLONASE) 50 MCG/ACT nasal spray   azelastine (ASTELIN) 0.1 % nasal spray   Fatigue    Vague sxs, fatigue, constipation, hair loss Fhx of hashimoto's thyroiditis Ordered ab labs, tsh + t4.        Relevant Orders   Comprehensive Metabolic Panel (CMET)   TSH + free T4   Thyroid Peroxidase Antibodies (TPO) (REFL)   Thyrotropin receptor autoabs   H. pylori breath test   CBC   Other Visit Diagnoses     Epigastric pain       Relevant Medications   omeprazole (PRILOSEC) 20 MG capsule   Other Relevant Orders   Lipase   Comprehensive Metabolic Panel (CMET)   US Abdomen Limited RUQ (LIVER/GB)   H. pylori breath test   CBC   Hair loss       Relevant Orders   TSH + free T4   Thyroid Peroxidase Antibodies (TPO) (REFL)   Thyrotropin receptor autoabs   Constipation, unspecified constipation type       Relevant Orders   TSH + free T4   Thyroid Peroxidase Antibodies (TPO) (REFL)   Thyrotropin receptor autoabs   Hyperglycemia       Relevant Orders   Comprehensive Metabolic Panel (CMET)   HgB A1c   Family history of Hashimoto thyroiditis       Relevant Orders   TSH + free T4   Thyroid Peroxidase Antibodies (TPO) (REFL)   Thyrotropin receptor autoabs   Screening for HIV (human immunodeficiency virus)       Relevant Orders   HIV antibody (with reflex)   Encounter for hepatitis C screening test for low risk patient       Relevant Orders    Hepatitis C antibody        Return will f/u with results and determine POC.      I, Mikey Kirschner, PA-C have reviewed all documentation for this visit. The documentation on  04/12/2021  for the exam, diagnosis, procedures, and orders are all accurate and complete.    Mikey Kirschner, PA-C  [facility]  [phone] (phone) [phone] (fax)  [place]

## 2021-04-12 NOTE — Assessment & Plan Note (Signed)
Vague sxs, fatigue, constipation, hair loss Fhx of hashimoto's thyroiditis Ordered ab labs, tsh + t4.

## 2021-04-12 NOTE — Assessment & Plan Note (Addendum)
ddx gastritis vs hpylori vs biliary colic Ordered CBC, hpylori breath test, CMP, lipase RUQ [place] ordered, if hyplori + can cancel ultrasound Advised she start omeprazole 20 mg in AM before food can take TUMS for breakthrough pain Advised may need ref to GI for endoscopy

## 2021-04-12 NOTE — Assessment & Plan Note (Signed)
PND and congestion rx flonase and azelastine can alternate AM and PM Advised humidifer

## 2021-04-13 LAB — CBC
Hematocrit: 39.4 % (ref 34.0–46.6)
Hemoglobin: 12.8 g/dL (ref 11.1–15.9)
MCH: 27.8 pg (ref 26.6–33.0)
MCHC: 32.5 g/dL (ref 31.5–35.7)
MCV: 86 fL (ref 79–97)
Platelets: 319 10*3/uL (ref 150–450)
RBC: 4.6 x10E6/uL (ref 3.77–5.28)
RDW: 12.9 % (ref 11.7–15.4)
WBC: 4.5 10*3/uL (ref 3.4–10.8)

## 2021-04-13 LAB — TSH+FREE T4
Free T4: 1.29 ng/dL (ref 0.82–1.77)
TSH: 4.02 u[IU]/mL (ref 0.450–4.500)

## 2021-04-13 LAB — COMPREHENSIVE METABOLIC PANEL
ALT: 14 IU/L (ref 0–32)
AST: 17 IU/L (ref 0–40)
Albumin/Globulin Ratio: 1.6 (ref 1.2–2.2)
Albumin: 4.6 g/dL (ref 3.9–5.0)
Alkaline Phosphatase: 63 IU/L (ref 44–121)
BUN/Creatinine Ratio: 10 (ref 9–23)
BUN: 7 mg/dL (ref 6–20)
Bilirubin Total: 0.3 mg/dL (ref 0.0–1.2)
CO2: 24 mmol/L (ref 20–29)
Calcium: 9.3 mg/dL (ref 8.7–10.2)
Chloride: 100 mmol/L (ref 96–106)
Creatinine, Ser: 0.7 mg/dL (ref 0.57–1.00)
Globulin, Total: 2.9 g/dL (ref 1.5–4.5)
Glucose: 90 mg/dL (ref 70–99)
Potassium: 4.5 mmol/L (ref 3.5–5.2)
Sodium: 136 mmol/L (ref 134–144)
Total Protein: 7.5 g/dL (ref 6.0–8.5)
eGFR: 121 mL/min/{1.73_m2} (ref >59)

## 2021-04-13 LAB — H. PYLORI BREATH TEST: H pylori Breath Test: NEGATIVE

## 2021-04-13 LAB — LIPASE: Lipase: 31 U/L (ref 14–72)

## 2021-04-13 LAB — HIV ANTIBODY (ROUTINE TESTING W REFLEX): HIV Screen 4th Generation wRfx: NONREACTIVE

## 2021-04-13 LAB — HEPATITIS C ANTIBODY: Hep C Virus Ab: 0.2 s/co ratio (ref 0.0–0.9)

## 2021-04-13 LAB — THYROID PEROXIDASE ANTIBODY: Thyroperoxidase Ab SerPl-aCnc: 11 IU/mL (ref 0–34)

## 2021-04-13 LAB — HEMOGLOBIN A1C
Est. average glucose Bld gHb Est-mCnc: 111 mg/dL
Hgb A1c MFr Bld: 5.5 % (ref 4.8–5.6)

## 2021-04-13 LAB — THYROTROPIN RECEPTOR AUTOABS: Thyrotropin Receptor Ab: <1.1 IU/L (ref 0.00–1.75)

## 2021-04-15 ENCOUNTER — Ambulatory Visit: Payer: BC Managed Care – PPO

## 2021-05-06 ENCOUNTER — Ambulatory Visit: Payer: BC Managed Care – PPO

## 2021-05-06 ENCOUNTER — Ambulatory Visit: Payer: Self-pay

## 2021-05-06 ENCOUNTER — Telehealth: Payer: BC Managed Care – PPO | Admitting: Physician Assistant

## 2021-05-06 DIAGNOSIS — R3989 Other symptoms and signs involving the genitourinary system: Secondary | ICD-10-CM | POA: Diagnosis not present

## 2021-05-06 MED ORDER — SULFAMETHOXAZOLE-TRIMETHOPRIM 800-160 MG PO TABS: 1.0000 | ORAL_TABLET | Freq: Two times a day (BID) | ORAL | 0 refills | Status: DC

## 2021-05-06 NOTE — Progress Notes (Signed)
Virtual Visit Consent   Tammy Washington, you are scheduled for a virtual visit with a [facility] provider today.     Just as with appointments in the office, your consent must be obtained to participate.  Your consent will be active for this visit and any virtual visit you may have with one of our providers in the next 365 days.     If you have a MyChart account, a copy of this consent can be sent to you electronically.  All virtual visits are billed to your insurance company just like a traditional visit in the office.    As this is a virtual visit, video technology does not allow for your provider to perform a traditional examination.  This may limit your provider's ability to fully assess your condition.  If your provider identifies any concerns that need to be evaluated in person or the need to arrange testing (such as labs, EKG, etc.), we will make arrangements to do so.     Although advances in technology are sophisticated, we cannot ensure that it will always work on either your end or our end.  If the connection with a video visit is poor, the visit may have to be switched to a telephone visit.  With either a video or telephone visit, we are not always able to ensure that we have a secure connection.     I need to obtain your verbal consent now.   Are you willing to proceed with your visit today?    Tammy Washington has provided verbal consent on 05/06/2021 for a virtual visit (video or telephone).   Mar Daring, PA-C   Date: 05/06/2021 1:07 PM   Virtual Visit via Video Note   I, Mar Daring, connected with  Tammy Washington  ([id number], [birth year]) on 05/06/21 at  1:00 PM EST by a video-enabled telemedicine application and verified that I am speaking with the correct person using two identifiers.  Location: Patient: Virtual Visit Location Patient: Home Provider: Virtual Visit Location Provider: Home Office   I discussed the limitations of evaluation and  management by telemedicine and the availability of in person appointments. The patient expressed understanding and agreed to proceed.    History of Present Illness: Tammy Washington is a 31 y.o. who identifies as a female who was assigned female at birth, and is being seen today for suspected uti.  HPI: Urinary Tract Infection  This is a new problem. The current episode started in the past 7 days (2 days). The problem has been gradually worsening. The quality of the pain is described as aching and stabbing. The patient is experiencing no pain. There has been no fever. Associated symptoms include frequency, hesitancy, nausea (chronic) and urgency. Pertinent negatives include no chills, discharge, flank pain, hematuria or vomiting. She has tried increased fluids (AZO) for the symptoms. The treatment provided no relief. Her past medical history is significant for recurrent UTIs.   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 Use in each nostril as directed, Disp: 30 mL, Rfl: 12   famotidine (PEPCID) 20 MG tablet, Start famotidine 40 mg, PO, twice daily for the next 7 days then take 20 mg as needed twice daily., Disp: 60  tablet, Rfl: 0   fluticasone (FLONASE) 50 MCG/ACT nasal spray, Place 2 sprays into both nostrils daily., Disp: 16 g, Rfl: 6   omeprazole (PRILOSEC) 20 MG capsule, Take 1 capsule (20 mg total) by mouth daily., Disp: 30 capsule, Rfl: 3   ondansetron (ZOFRAN) 4 MG tablet, Take 1 tablet (4 mg total) by mouth every 8 (eight) hours as needed., Disp: 20 tablet, Rfl: 5   promethazine (PHENERGAN) 25 MG tablet, Take 1 tablet (25 mg total) by mouth every 6 (six) hours as needed for nausea or vomiting., Disp: 30 tablet, Rfl: 0  Observations/Objective: Patient is well-developed, well-nourished in no acute distress.  Resting comfortably  at home.  Head is normocephalic, atraumatic.  No labored breathing.  Speech is clear and coherent with logical content.  Patient is alert and oriented at baseline.    Assessment and Plan: 1. Suspected UTI - sulfamethoxazole-trimethoprim (BACTRIM DS) 800-160 MG tablet; Take 1 tablet by mouth 2 (two) times daily.  Dispense: 10 tablet; Refill: 0  - Worsening symptoms.  - At home UA positive.  - Will treat empirically with Bactrim  - May continue AZO for spasm.  - Continue to push fluids.  - She is to seek in person evaluation if symptoms do not improve or if they worsen.    Follow Up Instructions: I discussed the assessment and treatment plan with the patient. The patient was provided an opportunity to ask questions and all were answered. The patient agreed with the plan and demonstrated an understanding of the instructions.  A copy of instructions were sent to the patient via MyChart unless otherwise noted below.    The patient was advised to call back or seek an in-person evaluation if the symptoms worsen or if the condition fails to improve as anticipated.  Time:  I spent 8 minutes with the patient via telehealth technology discussing the above problems/concerns.    Mar Daring, PA-C

## 2021-05-06 NOTE — Telephone Encounter (Signed)
Patient called, left VM to return the call to the office to discuss symptoms with a nurse.   Summary: UTI   Pt took a UTI test at home and it was positive / she would like to speak with a nurse about getting an antibiotic / please advise

## 2021-05-06 NOTE — Telephone Encounter (Signed)
° ° °  Chief Complaint: Urinary frequency and burning Symptoms: Above Frequency: Started 2 days ago Pertinent Negatives: Patient denies fever Disposition: [] ED /[] Urgent Care (no appt availability in office) / [] Appointment(In office/virtual)/ [x]  [facility]/ [] Home Care/ [] Refused Recommended Disposition /[] [facility]/ []  Follow-up with PCP Additional Notes: No availability in practice. Discussed [facility]. Asking PCP to call in antibiotics - "she usually calls something in." Please advise pt.  Reason for Disposition  Urinating more frequently than usual (i.e., frequency)  Answer Assessment - Initial Assessment Questions 1. SYMPTOM: "What's the main symptom you're concerned about?" (e.g., frequency, incontinence)     Frequency, burning 2. ONSET: "When did the  symptoms  start?"     2 days ago 3. PAIN: "Is there any pain?" If Yes, ask: "How bad is it?" (Scale: 1-10; mild, moderate, severe)     Yes 4. CAUSE: "What do you think is causing the symptoms?"     UTI 5. OTHER SYMPTOMS: "Do you have any other symptoms?" (e.g., fever, flank pain, blood in urine, pain with urination)     No 6. PREGNANCY: "Is there any chance you are pregnant?" "When was your last menstrual period?"     No  Protocols used: Urinary Symptoms-A-AH

## 2021-05-06 NOTE — Patient Instructions (Signed)
Tammy Washington, thank you for joining Mar Daring, PA-C for today's virtual visit.  While this provider is not your primary care provider (PCP), if your PCP is located in our provider database this encounter information will be shared with them immediately following your visit.  Consent: (Patient) Tammy Washington provided verbal consent for this virtual visit at the beginning of the encounter.  Current Medications:  Current Outpatient Medications:    sulfamethoxazole-trimethoprim (BACTRIM DS) 800-160 MG tablet, Take 1 tablet by mouth 2 (two) times daily., Disp: 10 tablet, Rfl: 0   azelastine (ASTELIN) 0.1 % nasal spray, Place 2 sprays into both nostrils 2 (two) times daily. Use in each nostril as directed, Disp: 30 mL, Rfl: 12   famotidine (PEPCID) 20 MG tablet, Start famotidine 40 mg, PO, twice daily for the next 7 days then take 20 mg as needed twice daily., Disp: 60 tablet, Rfl: 0   fluticasone (FLONASE) 50 MCG/ACT nasal spray, Place 2 sprays into both nostrils daily., Disp: 16 g, Rfl: 6   omeprazole (PRILOSEC) 20 MG capsule, Take 1 capsule (20 mg total) by mouth daily., Disp: 30 capsule, Rfl: 3   ondansetron (ZOFRAN) 4 MG tablet, Take 1 tablet (4 mg total) by mouth every 8 (eight) hours as needed., Disp: 20 tablet, Rfl: 5   promethazine (PHENERGAN) 25 MG tablet, Take 1 tablet (25 mg total) by mouth every 6 (six) hours as needed for nausea or vomiting., Disp: 30 tablet, Rfl: 0   Medications ordered in this encounter:  Meds ordered this encounter  Medications   sulfamethoxazole-trimethoprim (BACTRIM DS) 800-160 MG tablet    Sig: Take 1 tablet by mouth 2 (two) times daily.    Dispense:  10 tablet    Refill:  0    Order Specific Question:   Supervising Provider    Answer:   Sabra Heck, BRIAN [[id number]]     *If you need refills on other medications prior to your next appointment, please contact your pharmacy*  Follow-Up: Call back or seek an in-person evaluation if the symptoms  worsen or if the condition fails to improve as anticipated.  Other Instructions Urinary Tract Infection, Adult A urinary tract infection (UTI) is an infection of any part of the urinary tract. The urinary tract includes the kidneys, ureters, bladder, and urethra. These organs make, store, and get rid of urine in the body. An upper UTI affects the ureters and kidneys. A lower UTI affects the bladder and urethra. What are the causes? Most urinary tract infections are caused by bacteria in your genital area around your urethra, where urine leaves your body. These bacteria grow and cause inflammation of your urinary tract. What increases the risk? You are more likely to develop this condition if: You have a urinary catheter that stays in place. You are not able to control when you urinate or have a bowel movement (incontinence). You are female and you: Use a spermicide or diaphragm for birth control. Have low estrogen levels. Are pregnant. You have certain genes that increase your risk. You are sexually active. You take antibiotic medicines. You have a condition that causes your flow of urine to slow down, such as: An enlarged prostate, if you are female. Blockage in your urethra. A kidney stone. A nerve condition that affects your bladder control (neurogenic bladder). Not getting enough to drink, or not urinating often. You have certain medical conditions, such as: Diabetes. A weak disease-fighting system (immunesystem). Sickle cell disease. Gout. Spinal cord injury. What are  the signs or symptoms? Symptoms of this condition include: Needing to urinate right away (urgency). Frequent urination. This may include small amounts of urine each time you urinate. Pain or burning with urination. Blood in the urine. Urine that smells bad or unusual. Trouble urinating. Cloudy urine. Vaginal discharge, if you are female. Pain in the abdomen or the lower back. You may also have: Vomiting or  a decreased appetite. Confusion. Irritability or tiredness. A fever or chills. Diarrhea. The first symptom in older adults may be confusion. In some cases, they may not have any symptoms until the infection has worsened. How is this diagnosed? This condition is diagnosed based on your medical history and a physical exam. You may also have other tests, including: Urine tests. Blood tests. Tests for STIs (sexually transmitted infections). If you have had more than one UTI, a cystoscopy or imaging studies may be done to determine the cause of the infections. How is this treated? Treatment for this condition includes: Antibiotic medicine. Over-the-counter medicines to treat discomfort. Drinking enough water to stay hydrated. If you have frequent infections or have other conditions such as a kidney stone, you may need to see a health care provider who specializes in the urinary tract (urologist). In rare cases, urinary tract infections can cause sepsis. Sepsis is a life-threatening condition that occurs when the body responds to an infection. Sepsis is treated in the hospital with IV antibiotics, fluids, and other medicines. Follow these instructions at home: Medicines Take over-the-counter and prescription medicines only as told by your health care provider. If you were prescribed an antibiotic medicine, take it as told by your health care provider. Do not stop using the antibiotic even if you start to feel better. General instructions Make sure you: Empty your bladder often and completely. Do not hold urine for long periods of time. Empty your bladder after sex. Wipe from front to back after urinating or having a bowel movement if you are female. Use each tissue only one time when you wipe. Drink enough fluid to keep your urine pale yellow. Keep all follow-up visits. This is important. Contact a health care provider if: Your symptoms do not get better after 1-2 days. Your symptoms go  away and then return. Get help right away if: You have severe pain in your back or your lower abdomen. You have a fever or chills. You have nausea or vomiting. Summary A urinary tract infection (UTI) is an infection of any part of the urinary tract, which includes the kidneys, ureters, bladder, and urethra. Most urinary tract infections are caused by bacteria in your genital area. Treatment for this condition often includes antibiotic medicines. If you were prescribed an antibiotic medicine, take it as told by your health care provider. Do not stop using the antibiotic even if you start to feel better. Keep all follow-up visits. This is important. This information is not intended to replace advice given to you by your health care provider. Make sure you discuss any questions you have with your health care provider. Document Revised: 11/21/2019 Document Reviewed: 11/21/2019 Elsevier Patient Education  2022 [employer].    If you have been instructed to have an in-person evaluation today at a local Urgent Care facility, please use the link below. It will take you to a list of all of our available [facility], including address, phone number and hours of operation. Please do not delay care.  [facility]  If you or a family member  do not have a primary care provider, use the link below to schedule a visit and establish care. When you choose a [facility] primary care physician or advanced practice provider, you gain a long-term partner in health. Find a Primary Care Provider  Learn more about [facility]'s in-office and virtual care options: [facility]

## 2021-05-09 NOTE — Telephone Encounter (Signed)
Patient had virtual visit with Fenton Malling on 05/06/20 for symptoms and was treated. KW

## 2021-05-21 ENCOUNTER — Other Ambulatory Visit: Payer: Self-pay

## 2021-05-21 ENCOUNTER — Encounter: Payer: Self-pay | Admitting: Emergency Medicine

## 2021-05-21 ENCOUNTER — Ambulatory Visit
Admission: EM | Admit: 2021-05-21 | Discharge: 2021-05-21 | Disposition: A | Discharge status: Home / Self Care | Payer: BC Managed Care – PPO | Attending: Emergency Medicine | Admitting: Emergency Medicine

## 2021-05-21 DIAGNOSIS — Z113 Encounter for screening for infections with a predominantly sexual mode of transmission: Secondary | ICD-10-CM | POA: Diagnosis not present

## 2021-05-21 DIAGNOSIS — R300 Dysuria: Secondary | ICD-10-CM | POA: Insufficient documentation

## 2021-05-21 DIAGNOSIS — N898 Other specified noninflammatory disorders of vagina: Secondary | ICD-10-CM | POA: Insufficient documentation

## 2021-05-21 LAB — POCT URINALYSIS DIP (MANUAL ENTRY)
Bilirubin, UA: NEGATIVE
Blood, UA: NEGATIVE
Glucose, UA: 100 mg/dL — AB
Ketones, POC UA: NEGATIVE mg/dL
Leukocytes, UA: NEGATIVE
Nitrite, UA: POSITIVE — AB
Protein Ur, POC: NEGATIVE mg/dL
Spec Grav, UA: <=1.005 — AB (ref 1.010–1.025)
Urobilinogen, UA: 0.2 E.U./dL
pH, UA: 5 (ref 5.0–8.0)

## 2021-05-21 LAB — POCT URINE PREGNANCY: Preg Test, Ur: NEGATIVE

## 2021-05-21 MED ORDER — METRONIDAZOLE 500 MG PO TABS: 500.0000 mg | ORAL_TABLET | Freq: Two times a day (BID) | ORAL | 0 refills | Status: DC

## 2021-05-21 MED ORDER — CEPHALEXIN 500 MG PO CAPS: 500.0000 mg | ORAL_CAPSULE | Freq: Two times a day (BID) | ORAL | 0 refills | Status: AC

## 2021-05-21 NOTE — ED Provider Notes (Signed)
[facility]    CSN: [id number] Arrival date & time: 05/21/21  1255      History   Chief Complaint Chief Complaint  Patient presents with   Dysuria   Back Pain    HPI Tammy Washington is a 31 y.o. female.  Patient presents with dysuria, frequency, urgency x 2 weeks.  She had a video visit on 05/06/2021; diagnosed with suspected UTI; treated with Bactrim DS.  Her symptoms improved until the last day of treatment then returned. Her symptoms got worse 2-3 days ago.  She has low back pain.  She also reports yellow-green malodorous vaginal discharge x 3 days. No fever, rash, abdominal pain, pelvic pain, or other symptoms.  LMP 2 weeks ago.    The history is provided by the patient.   Past Medical History:  Diagnosis Date   Allergy    COVID-19 09/2020   H/O colonoscopy    Migraine headache     Patient Active Problem List   Diagnosis Date Noted   Gastroesophageal reflux disease 04/12/2021   Nausea 04/12/2021   Nasal congestion 04/12/2021   Fatigue 04/12/2021   Irritable bowel syndrome with both constipation and diarrhea 04/27/2020   Anal fissure 04/27/2020   Diarrhea 06/17/2019   Allergic rhinitis 11/19/2017   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   Migraines 11/19/2017   Family history of polyps in the colon 11/19/2017    Past Surgical History:  Procedure Laterality Date   COLONOSCOPY WITH PROPOFOL N/A 12/12/2017   Procedure: COLONOSCOPY WITH PROPOFOL;  Surgeon: Lin Landsman, MD;  Location: [facility];  Service: Endoscopy;  Laterality: N/A;   NO PAST SURGERIES      OB History   No obstetric history on file.      Home Medications    Prior to Admission medications   Medication Sig Start Date End Date Taking? Authorizing Provider  cephALEXin (KEFLEX) 500 MG capsule Take 1 capsule (500 mg total) by mouth 2 (two) times daily for 5 days. 05/21/21 05/26/21 Yes Sharion Balloon, NP  metroNIDAZOLE (FLAGYL) 500 MG tablet Take 1 tablet  (500 mg total) by mouth 2 (two) times daily. 05/21/21  Yes Sharion Balloon, NP  azelastine (ASTELIN) 0.1 % nasal spray Place 2 sprays into both nostrils 2 (two) times daily. Use in each nostril as directed 04/12/21   Drubel, Ria Comment, PA-C  famotidine (PEPCID) 20 MG tablet Start famotidine 40 mg, PO, twice daily for the next 7 days then take 20 mg as needed twice daily. 03/14/21   Scot Jun, FNP  fluticasone (FLONASE) 50 MCG/ACT nasal spray Place 2 sprays into both nostrils daily. 04/12/21   Mikey Kirschner, PA-C  omeprazole (PRILOSEC) 20 MG capsule Take 1 capsule (20 mg total) by mouth daily. 04/12/21   Mikey Kirschner, PA-C  ondansetron (ZOFRAN) 4 MG tablet Take 1 tablet (4 mg total) by mouth every 8 (eight) hours as needed. 07/26/20   Virginia Crews, MD  promethazine (PHENERGAN) 25 MG tablet Take 1 tablet (25 mg total) by mouth every 6 (six) hours as needed for nausea or vomiting. 10/21/20   Sharion Balloon, NP    Family History Family History  Problem Relation Age of Onset   Healthy Mother    Rheum arthritis Father    Colon polyps Sister 44       serrated adenoma   Healthy Sister    Diabetes type I Paternal Aunt    Dementia Maternal Grandmother    Colon  cancer Neg Hx    Breast cancer Neg Hx    Ovarian cancer Neg Hx    Cervical cancer Neg Hx     Social History Social History   Tobacco Use   Smoking status: Former    Years: 3.00    Types: Cigarettes    Quit date: 04/24/2015    Years since quitting: 6.0   Smokeless tobacco: Never   Tobacco comments:    former social smoker  Vaping Use   Vaping Use: Never used  Substance Use Topics   Alcohol use: Yes    Alcohol/week: 1.0 - 2.0 standard drink    Types: 1 - 2 Cans of beer per week   Drug use: Never     Allergies   Patient has no known allergies.   Review of Systems Review of Systems  Constitutional:  Negative for chills and fever.  Respiratory:  Negative for cough and shortness of breath.   Cardiovascular:   Negative for chest pain and palpitations.  Gastrointestinal:  Negative for abdominal pain, nausea and vomiting.  Genitourinary:  Positive for dysuria, frequency, urgency and vaginal discharge. Negative for hematuria and pelvic pain.  Musculoskeletal:  Positive for back pain. Negative for gait problem.  Skin:  Negative for color change and rash.  All other systems reviewed and are negative.   Physical Exam Triage Vital Signs ED Triage Vitals  Enc Vitals Group     BP      Pulse      Resp      Temp      Temp src      SpO2      Weight      Height      Head Circumference      Peak Flow      Pain Score      Pain Loc      Pain Edu?      Excl. in [place]?    No data found.  Updated Vital Signs BP 124/83 (BP Location: Left Arm)    Pulse 72    Temp 98.2 F (36.8 C)    Resp 18    LMP 05/06/2021    SpO2 98%   Visual Acuity Right Eye Distance:   Left Eye Distance:   Bilateral Distance:    Right Eye Near:   Left Eye Near:    Bilateral Near:     Physical Exam Vitals and nursing note reviewed.  Constitutional:      General: She is not in acute distress.    Appearance: She is well-developed. She is not ill-appearing.  HENT:     Mouth/Throat:     Mouth: Mucous membranes are moist.  Cardiovascular:     Rate and Rhythm: Normal rate and regular rhythm.     Heart sounds: Normal heart sounds.  Pulmonary:     Effort: Pulmonary effort is normal. No respiratory distress.     Breath sounds: Normal breath sounds.  Abdominal:     General: Bowel sounds are normal.     Palpations: Abdomen is soft.     Tenderness: There is no abdominal tenderness. There is no right CVA tenderness, left CVA tenderness, guarding or rebound.  Musculoskeletal:     Cervical back: Neck supple.  Skin:    General: Skin is warm and dry.  Neurological:     Mental Status: She is alert.  Psychiatric:        Mood and Affect: Mood normal.  Behavior: Behavior normal.     UC Treatments / Results  Labs (all  labs ordered are listed, but only abnormal results are displayed) Labs Reviewed  POCT URINALYSIS DIP (MANUAL ENTRY) - Abnormal; Notable for the following components:      Result Value   Color, UA orange (*)    Glucose, UA =100 (*)    Spec Grav, UA <=1.005 (*)    Nitrite, UA Positive (*)    All other components within normal limits  URINE CULTURE  POCT URINE PREGNANCY  CERVICOVAGINAL ANCILLARY ONLY    EKG   Radiology No results found.  Procedures Procedures (including critical care time)  Medications Ordered in UC Medications - No data to display  Initial Impression / Assessment and Plan / UC Course  I have reviewed the triage vital signs and the nursing notes.  Pertinent labs & imaging results that were available during my care of the patient were reviewed by me and considered in my medical decision making (see chart for details).   Dysuria, vaginal discharge, STD screening.  Treating with Keflex. Urine culture pending. Discussed with patient that we will call her if the urine culture shows the need to change or discontinue the antibiotic. Patient obtained vaginal self swab for testing.  Treating with metronidazole.  Discussed that we will call if test results are positive.  Discussed that she may require additional treatment at that time.  Instructed patient to abstain from sexual activity for at least 7 days.  Instructed her to follow-up with her PCP or gynecologist if her symptoms are not improving.  Patient agrees to plan of care.      Final Clinical Impressions(s) / UC Diagnoses   Final diagnoses:  Dysuria  Vaginal discharge  Screening for STD (sexually transmitted disease)     Discharge Instructions      Take the antibiotic as directed.  The urine culture is pending.  We will call you if it shows the need to change or discontinue your antibiotic.    Take metronidazole twice a day for 7 days.    Your vaginal tests are pending.  If your test results are positive,  we will call you.  Do not have sexual activity for at least 7 days.    Follow up with your primary care provider or gynecologist if your symptoms are not improving.        ED Prescriptions     Medication Sig Dispense Auth. Provider   cephALEXin (KEFLEX) 500 MG capsule Take 1 capsule (500 mg total) by mouth 2 (two) times daily for 5 days. 10 capsule Sharion Balloon, NP   metroNIDAZOLE (FLAGYL) 500 MG tablet Take 1 tablet (500 mg total) by mouth 2 (two) times daily. 14 tablet Sharion Balloon, NP      PDMP not reviewed this encounter.   Sharion Balloon, NP 05/21/21 1456

## 2021-05-21 NOTE — ED Triage Notes (Signed)
Pt here with increased urinary urgency and frequency with back pain x 2 days. Took an at home UTI test and was positive for Leukocytes and a nitrites. Took AZO today.

## 2021-05-21 NOTE — Discharge Instructions (Addendum)
Take the antibiotic as directed.  The urine culture is pending.  We will call you if it shows the need to change or discontinue your antibiotic.    Take metronidazole twice a day for 7 days.    Your vaginal tests are pending.  If your test results are positive, we will call you.  Do not have sexual activity for at least 7 days.    Follow up with your primary care provider or gynecologist if your symptoms are not improving.

## 2021-05-23 LAB — URINE CULTURE: Culture: NO GROWTH

## 2021-05-23 LAB — CERVICOVAGINAL ANCILLARY ONLY
Bacterial Vaginitis (gardnerella): NEGATIVE
Candida Glabrata: NEGATIVE
Candida Vaginitis: NEGATIVE
Chlamydia: NEGATIVE
Comment: NEGATIVE
Comment: NEGATIVE
Comment: NEGATIVE
Comment: NEGATIVE
Comment: NEGATIVE
Comment: NORMAL
Neisseria Gonorrhea: NEGATIVE
Trichomonas: NEGATIVE

## 2021-07-08 ENCOUNTER — Other Ambulatory Visit: Payer: Self-pay | Admitting: Physician Assistant

## 2021-07-08 DIAGNOSIS — R1013 Epigastric pain: Secondary | ICD-10-CM

## 2021-07-08 DIAGNOSIS — K219 Gastro-esophageal reflux disease without esophagitis: Secondary | ICD-10-CM

## 2021-07-08 DIAGNOSIS — R110 Nausea: Secondary | ICD-10-CM

## 2021-09-12 ENCOUNTER — Ambulatory Visit: Payer: Self-pay

## 2021-09-12 NOTE — Progress Notes (Unsigned)
I,Rodolfo Gaster J Zemirah Krasinski,acting as a scribe for [name], DO.,have documented all relevant documentation on the behalf of [name], DO,as directed by  [name], DO while in the presence of [name], DO.   Established patient visit   Patient: [name]   DOB: 10-Nov-1990   31 y.o. Female  MRN: [id number] Visit Date: 09/13/2021  Today's healthcare provider: [name], DO   Chief Complaint  Patient presents with   Anxiety    Patient complains of increased anxiety starting again 2 days ago.    Subjective    HPI HPI     Anxiety    Additional comments: Patient complains of increased anxiety starting again 2 days ago.       Last edited by Smitty Knudsen, CMA on 09/13/2021 11:28 AM.       Rash - few weeks duration. Thinks may be due to bug bite? On bottom side of R breast. Still with redness though improved. Denies oozing, discharge, fevers.  Anxiety, Follow-up  She was last seen for anxiety 08/15/19 Changes made at last visit include try prescribed medication.   She reports poor compliance with treatment, due to stomach issues.  She reports poor tolerance of treatment. She is having side effects. Possible abdominal upset.   She feels her anxiety is severe and Worse since last visit. Had a panic attack last night.  Anxiety - Medications: none currently. Previously on buproprion, duloxetine, zoloft, celexa. Feels meds made IBS worse. Tried hydroxyzine but feels made anxiety worse.  - Taking: n/a - Counseling: not currently, last saw counselor few months ago. Has good therapeutic relationship. - Symptoms: see below - Current stressors: job - Coping Mechanisms: meditation, walking dogs  Symptoms: No chest pain Yes difficulty concentrating  Yes dizziness No fatigue  Yes feelings of losing control Yes insomnia  Yes irritable Yes palpitations  Yes panic attacks Yes racing thoughts  Yes shortness of breath Yes sweating  Yes  tremors/shakes    GAD-7 Results    09/13/2021   12:06 PM 08/15/2019   10:19 AM 06/17/2019   10:32 AM  GAD-7 Generalized Anxiety Disorder Screening Tool  1. Feeling Nervous, Anxious, or on Edge '3 2 3  '$ 2. Not Being Able to Stop or Control Worrying '3 2 3  '$ 3. Worrying Too Much About Different Things '3 2 3  '$ 4. Trouble Relaxing '3 2 3  '$ 5. Being So Restless it's Hard To Sit Still '3 2 2  '$ 6. Becoming Easily Annoyed or Irritable '3 2 3  '$ 7. Feeling Afraid As If Something Awful Might Happen 2 0 2  Total GAD-7 Score '20 12 19  '$ Difficulty At Work, Home, or Getting  Along With Others? Very difficult Very difficult Very difficult    PHQ-9 Scores    09/13/2021   11:35 AM 04/12/2021    8:47 AM 07/26/2020    8:26 AM  PHQ9 SCORE ONLY  PHQ-9 Total Score '7 8 16    '$ ---------------------------------------------------------------------------------------------------   Medications: Outpatient Medications Prior to Visit  Medication Sig   fluticasone (FLONASE) 50 MCG/ACT nasal spray Place 2 sprays into both nostrils daily.   azelastine (ASTELIN) 0.1 % nasal spray Place 2 sprays into both nostrils 2 (two) times daily. Use in each nostril as directed (Patient not taking: Reported on 09/13/2021)   famotidine (PEPCID) 20 MG tablet Start famotidine 40 mg, PO, twice daily for the next 7 days then take 20 mg as needed twice daily.  metroNIDAZOLE (FLAGYL) 500 MG tablet Take 1 tablet (500 mg total) by mouth 2 (two) times daily.   omeprazole (PRILOSEC) 20 MG capsule TAKE 1 CAPSULE BY MOUTH EVERY DAY (Patient not taking: Reported on 09/13/2021)   ondansetron (ZOFRAN) 4 MG tablet Take 1 tablet (4 mg total) by mouth every 8 (eight) hours as needed. (Patient not taking: Reported on 09/13/2021)   promethazine (PHENERGAN) 25 MG tablet Take 1 tablet (25 mg total) by mouth every 6 (six) hours as needed for nausea or vomiting.   No facility-administered medications prior to visit.    Review of Systems     Objective    BP  128/70 (BP Location: Right Arm, Patient Position: Sitting, Cuff Size: Normal)   Pulse 72   Temp (!) 97.1 F (36.2 C) (Oral)   Resp 16   Ht 6' (1.829 m)   Wt 205 lb (93 kg)   SpO2 98%   BMI 27.80 kg/m    Physical Exam  Gen: well appearing, in NAD Card: RRR Lungs: CTAB Ext: WWP, no edema Skin: ~1-46m erythematous well healing boil noted to inferior R breast without induration, fluctuance, discharge.  Psych: pleasant, well groomed, appropriately dressed.   No results found for any visits on 09/13/21.  Assessment & Plan     Problem List Items Addressed This Visit       Digestive   Irritable bowel syndrome with both constipation and diarrhea     Musculoskeletal and Integument   Boil    Well healing, not currently infected. Continue conservative measures.          Other   Anxiety    Worsened with increase in stressor recently. Discussed restarting therapy. Given prior intolerance to other meds, will trial nortriptyline. Buspar prn for acute anxiety given intolerance to hydroxyzine. Will obtain labs given report of palpitations to rule out other causes. F/u in 2-4 weeks.        Relevant Medications   busPIRone (BUSPAR) 7.5 MG tablet   nortriptyline (PAMELOR) 10 MG capsule   Other Visit Diagnoses     Palpitation    -  Primary   Relevant Orders   TSH   CBC with Differential   Basic Metabolic Panel (BMET)        Return in about 3 weeks (around 10/04/2021) for anxiety.        AMyles Gip [phone](phone) [phone](fax)  [place]

## 2021-09-12 NOTE — Telephone Encounter (Signed)
Reason for Disposition . Symptoms interfere with work or school  Answer Assessment - Initial Assessment Questions 1. CONCERN: "Did anything happen that prompted you to call today?"      Last night - fluttering heart, can't breath - jittery 2. ANXIETY SYMPTOMS: "Can you describe how you (your loved one; patient) have been feeling?" (e.g., tense, restless, panicky, anxious, keyed up, overwhelmed, sense of impending doom).      Anxious - panicky - sob 3. ONSET: "How long have you been feeling this way?" (e.g., hours, days, weeks)     Doing well - has IBS from anxiety 4. SEVERITY: "How would you rate the level of anxiety?" (e.g., 0 - 10; or mild, moderate, severe).     Mild - moderate 5. FUNCTIONAL IMPAIRMENT: "How have these feelings affected your ability to do daily activities?" "Have you had more difficulty than usual doing your normal daily activities?" (e.g., getting better, same, worse; self-care, school, work, interactions)     worse 6. HISTORY: "Have you felt this way before?" "Have you ever been diagnosed with an anxiety problem in the past?" (e.g., generalized anxiety disorder, panic attacks, PTSD). If Yes, ask: "How was this problem treated?" (e.g., medicines, counseling, etc.)     yes 7. RISK OF HARM - SUICIDAL IDEATION: "Do you ever have thoughts of hurting or killing yourself?" If Yes, ask:  "Do you have these feelings now?" "Do you have a plan on how you would do this?"     no 8. TREATMENT:  "What has been done so far to treat this anxiety?" (e.g., medicines, relaxation strategies). "What has helped?"     Therapy, diet 9. TREATMENT - THERAPIST: "Do you have a counselor or therapist? Name?"     no 10. POTENTIAL TRIGGERS: "Do you drink caffeinated beverages (e.g., coffee, colas, teas), and how much daily?" "Do you drink alcohol or use any drugs?" "Have you started any new medicines recently?"     no 10. PATIENT SUPPORT: "Who is with you now?" "Who do you live with?" "Do you have  family or friends who you can talk to?"        yes 67. OTHER SYMPTOMS: "Do you have any other symptoms?" (e.g., feeling depressed, trouble concentrating, trouble sleeping, trouble breathing, palpitations or fast heartbeat, chest pain, sweating, nausea, or diarrhea)        12. PREGNANCY: "Is there any chance you are pregnant?" "When was your last menstrual period?"       no  Protocols used: Anxiety and Panic Attack-A-AH

## 2021-09-12 NOTE — Telephone Encounter (Signed)
  Chief Complaint: Anxiety Symptoms: Palpitations - panic Frequency: ongoing Pertinent Negatives: Patient denies Self harm Disposition: '[]'$ ED /'[]'$ Urgent Care (no appt availability in office) / '[x]'$ Appointment(In office/virtual)/ '[]'$  [facility]/ '[]'$ Home Care/ '[]'$ Refused Recommended Disposition /'[]'$  Mobile Bus/ '[]'$  Follow-up with PCP Additional Notes: Pt has a history of anxiety. She has been doing very well with her anxiety she used a therapist, medications (in the past,none recently), and diet to control her symptoms. Pt also has IBS as a result of anxiety. Last night pt had an anxiety attack.  Today is doing better.

## 2021-09-13 ENCOUNTER — Ambulatory Visit: Payer: BC Managed Care – PPO | Admitting: Family Medicine

## 2021-09-13 ENCOUNTER — Encounter: Payer: Self-pay | Admitting: Family Medicine

## 2021-09-13 VITALS — BP 128/70 | HR 72 | Temp 97.1°F | Resp 16 | Ht 72.0 in | Wt 205.0 lb

## 2021-09-13 DIAGNOSIS — L0292 Furuncle, unspecified: Secondary | ICD-10-CM | POA: Diagnosis not present

## 2021-09-13 DIAGNOSIS — R002 Palpitations: Secondary | ICD-10-CM | POA: Diagnosis not present

## 2021-09-13 DIAGNOSIS — F419 Anxiety disorder, unspecified: Secondary | ICD-10-CM | POA: Diagnosis not present

## 2021-09-13 DIAGNOSIS — K582 Mixed irritable bowel syndrome: Secondary | ICD-10-CM

## 2021-09-13 MED ORDER — NORTRIPTYLINE HCL 10 MG PO CAPS: 10.0000 mg | ORAL_CAPSULE | Freq: Every day | ORAL | 0 refills | Status: DC

## 2021-09-13 MED ORDER — BUSPIRONE HCL 7.5 MG PO TABS: 7.5000 mg | ORAL_TABLET | Freq: Three times a day (TID) | ORAL | 0 refills | Status: DC | PRN

## 2021-09-13 NOTE — Assessment & Plan Note (Signed)
Worsened with increase in stressor recently. Discussed restarting therapy. Given prior intolerance to other meds, will trial nortriptyline. Buspar prn for acute anxiety given intolerance to hydroxyzine. Will obtain labs given report of palpitations to rule out other causes. F/u in 2-4 weeks.

## 2021-09-13 NOTE — Assessment & Plan Note (Signed)
Well healing, not currently infected. Continue conservative measures.

## 2021-09-13 NOTE — Patient Instructions (Signed)
It was great to see you!  Our plans for today:  - Take the nortriptyline every day at bedtime.  - Take buspirone 3 times daily as needed for anxiety.  - Come back in 2-4 weeks for follow up.   We are checking some labs today, we will release these results to your MyChart.  Take care and seek immediate care sooner if you develop any concerns.   Dr. Ky Barban

## 2021-09-17 ENCOUNTER — Telehealth: Payer: BC Managed Care – PPO | Admitting: Nurse Practitioner

## 2021-09-17 DIAGNOSIS — B3731 Acute candidiasis of vulva and vagina: Secondary | ICD-10-CM | POA: Diagnosis not present

## 2021-09-17 MED ORDER — FLUCONAZOLE 150 MG PO TABS: 150.0000 mg | ORAL_TABLET | Freq: Once | ORAL | 0 refills | Status: AC

## 2021-09-17 NOTE — Patient Instructions (Signed)

## 2021-09-17 NOTE — Progress Notes (Signed)
Virtual Visit Consent   Tammy Washington, you are scheduled for a virtual visit with Mary-Margaret Hassell Done, [place], a [facility] provider, today.     Just as with appointments in the office, your consent must be obtained to participate.  Your consent will be active for this visit and any virtual visit you may have with one of our providers in the next 365 days.     If you have a MyChart account, a copy of this consent can be sent to you electronically.  All virtual visits are billed to your insurance company just like a traditional visit in the office.    As this is a virtual visit, video technology does not allow for your provider to perform a traditional examination.  This may limit your provider's ability to fully assess your condition.  If your provider identifies any concerns that need to be evaluated in person or the need to arrange testing (such as labs, EKG, etc.), we will make arrangements to do so.     Although advances in technology are sophisticated, we cannot ensure that it will always work on either your end or our end.  If the connection with a video visit is poor, the visit may have to be switched to a telephone visit.  With either a video or telephone visit, we are not always able to ensure that we have a secure connection.     I need to obtain your verbal consent now.   Are you willing to proceed with your visit today? YES   Tammy Washington has provided verbal consent on 09/17/2021 for a virtual visit (video or telephone).   Mary-Margaret Hassell Done, FNP   Date: 09/17/2021 4:24 PM   Virtual Visit via Video Note   I, Mary-Margaret Hassell Done, connected with Tammy Washington ([id number], 1990-12-08) on 09/17/21 at  4:30 PM EDT by a video-enabled telemedicine application and verified that I am speaking with the correct person using two identifiers.  Location: Patient: Virtual Visit Location Patient: Home Provider: Virtual Visit Location Provider: Mobile   I discussed the  limitations of evaluation and management by telemedicine and the availability of in person appointments. The patient expressed understanding and agreed to proceed.    History of Present Illness: Tammy Washington is a 31 y.o. who identifies as a female who was assigned female at birth, and is being seen today for yeast.  HPI: Patient says she has been to the restroom several times a today and each time she has notice a " chuncky: white discharge when she wipes. Has had slight vaginal itching. Says she has never had this before. Deneis nay recent antibiotics, but she has been taking epsom salt soaks for hemorrhoids.   Review of Systems  Constitutional:  Negative for diaphoresis and weight loss.  Eyes:  Negative for blurred vision, double vision and pain.  Respiratory:  Negative for shortness of breath.   Cardiovascular:  Negative for chest pain, palpitations, orthopnea and leg swelling.  Gastrointestinal:  Negative for abdominal pain.  Skin:  Negative for rash.  Neurological:  Negative for dizziness, sensory change, loss of consciousness, weakness and headaches.  Endo/Heme/Allergies:  Negative for polydipsia. Does not bruise/bleed easily.  Psychiatric/Behavioral:  Negative for memory loss. The patient does not have insomnia.   All other systems reviewed and are negative.  Problems:  Patient Active Problem List   Diagnosis Date Noted   Boil 09/13/2021   Gastroesophageal reflux disease 04/12/2021   Nausea 04/12/2021   Nasal  congestion 04/12/2021   Fatigue 04/12/2021   Irritable bowel syndrome with both constipation and diarrhea 04/27/2020   Anal fissure 04/27/2020   Diarrhea 06/17/2019   Allergic rhinitis 11/19/2017   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   Migraines 11/19/2017   Family history of polyps in the colon 11/19/2017   Anxiety 11/19/2017    Allergies: No Known Allergies Medications:  Current Outpatient Medications:    fluconazole (DIFLUCAN) 150 MG  tablet, Take 1 tablet (150 mg total) by mouth once for 1 dose., Disp: 1 tablet, Rfl: 0   azelastine (ASTELIN) 0.1 % nasal spray, Place 2 sprays into both nostrils 2 (two) times daily. Use in each nostril as directed (Patient not taking: Reported on 09/13/2021), Disp: 30 mL, Rfl: 12   busPIRone (BUSPAR) 7.5 MG tablet, Take 1 tablet (7.5 mg total) by mouth 3 (three) times daily as needed., Disp: 90 tablet, Rfl: 0   famotidine (PEPCID) 20 MG tablet, Start famotidine 40 mg, PO, twice daily for the next 7 days then take 20 mg as needed twice daily., Disp: 60 tablet, Rfl: 0   fluticasone (FLONASE) 50 MCG/ACT nasal spray, Place 2 sprays into both nostrils daily., Disp: 16 g, Rfl: 6   nortriptyline (PAMELOR) 10 MG capsule, Take 1 capsule (10 mg total) by mouth at bedtime., Disp: 90 capsule, Rfl: 0   omeprazole (PRILOSEC) 20 MG capsule, TAKE 1 CAPSULE BY MOUTH EVERY DAY (Patient not taking: Reported on 09/13/2021), Disp: 90 capsule, Rfl: 1  Observations/Objective: Patient is well-developed, well-nourished in no acute distress.  Resting comfortably  at home.  Head is normocephalic, atraumatic.  No labored breathing.  Speech is clear and coherent with logical content.  Patient is alert and oriented at baseline.    Assessment and Plan:  Tammy Washington in today with chief complaint of Vaginitis   1. Vaginal candidiasis No bubble baths No douching  Meds ordered this encounter  Medications   fluconazole (DIFLUCAN) 150 MG tablet    Sig: Take 1 tablet (150 mg total) by mouth once for 1 dose.    Dispense:  1 tablet    Refill:  0    Order Specific Question:   Supervising Provider    Answer:   Noemi Chapel [[id number]]      Follow Up Instructions: I discussed the assessment and treatment plan with the patient. The patient was provided an opportunity to ask questions and all were answered. The patient agreed with the plan and demonstrated an understanding of the instructions.  A copy of instructions  were sent to the patient via MyChart.  The patient was advised to call back or seek an in-person evaluation if the symptoms worsen or if the condition fails to improve as anticipated.  Time:  I spent 7 minutes with the patient via telehealth technology discussing the above problems/concerns.    Mary-Margaret Hassell Done, FNP

## 2021-09-21 ENCOUNTER — Ambulatory Visit: Payer: Self-pay

## 2021-09-21 NOTE — Telephone Encounter (Signed)
  Chief Complaint: beige color "pebbles" Symptoms: constipated occasional abd pains that pt stated that is normal for her Frequency: yesterday and today Pertinent Negatives: Patient denies fever, bloody stools Disposition: '[]'$ ED /'[]'$ Urgent Care (no appt availability in office) / '[]'$ Appointment(In office/virtual)/ '[]'$   Virtual Care/ '[x]'$ Home Care/ '[]'$ Refused Recommended Disposition /'[]'$  Mobile Bus/ '[]'$  Follow-up with PCP Additional Notes: Pt thinks it could be related to Fluconazole. Additionally, gave pt instructions to drink prune juice, and pt advised may start OTC Mira-lax and to follow directions. Advised to call back if beige "pebbles" continues or develops worsening abdominal pain. Pt verbalized understanding.       Reason for Disposition  [1] Abnormal color is unexplained AND [2] present < 24 hours  Answer Assessment - Initial Assessment Questions 1. COLOR: "What color is it?" "Is that color in part or all of the stool?"     Beige- part 2. ONSET: "When was the unusual color first noted?"     Yesterday more  prominent  3. CAUSE: "Have you eaten any food or taken any medicine of this color?" (See listing in BACKGROUND)     Thinks fluconazole 4. OTHER SYMPTOMS: "Do you have any other symptoms?" (e.g., diarrhea, jaundice, abdominal pain, fever).    Severe constipation no BM since Sat  Protocols used: Stools - Unusual Color-A-AH

## 2021-09-22 NOTE — Telephone Encounter (Signed)
Noted. Agree with miralax

## 2021-10-05 ENCOUNTER — Other Ambulatory Visit: Payer: Self-pay | Admitting: Family Medicine

## 2021-10-05 NOTE — Telephone Encounter (Signed)
Requested Prescriptions  Pending Prescriptions Disp Refills  . busPIRone (BUSPAR) 7.5 MG tablet [Pharmacy Med Name: BUSPIRONE HCL 7.5 MG TABLET] 270 tablet 1    Sig: TAKE 1 TABLET (7.5 MG TOTAL) BY MOUTH 3 (THREE) TIMES DAILY AS NEEDED.     Psychiatry: Anxiolytics/Hypnotics - Non-controlled Passed - 10/05/2021  9:32 AM      Passed - Valid encounter within last 12 months    Recent Outpatient Visits          3 weeks ago [place], DO   5 months ago Gastroesophageal reflux disease, unspecified whether esophagitis present   [facility] Thedore Mins, [place], PA-C   1 year ago Diarrhea, unspecified type   [facility], Dionne Bucy, MD   1 year ago Irritable bowel syndrome with both constipation and diarrhea   [facility], Dionne Bucy, MD   1 year ago Colitis, acute   [facility], [place], [place]

## 2021-10-26 IMAGING — CR DG CHEST 2V
2 series · 2 of 2 positions shown · non-contrast
Comparison: None.

CLINICAL DATA: 28-year-old female with chest pain.

EXAM:
CHEST - 2 VIEW

[chest pa]
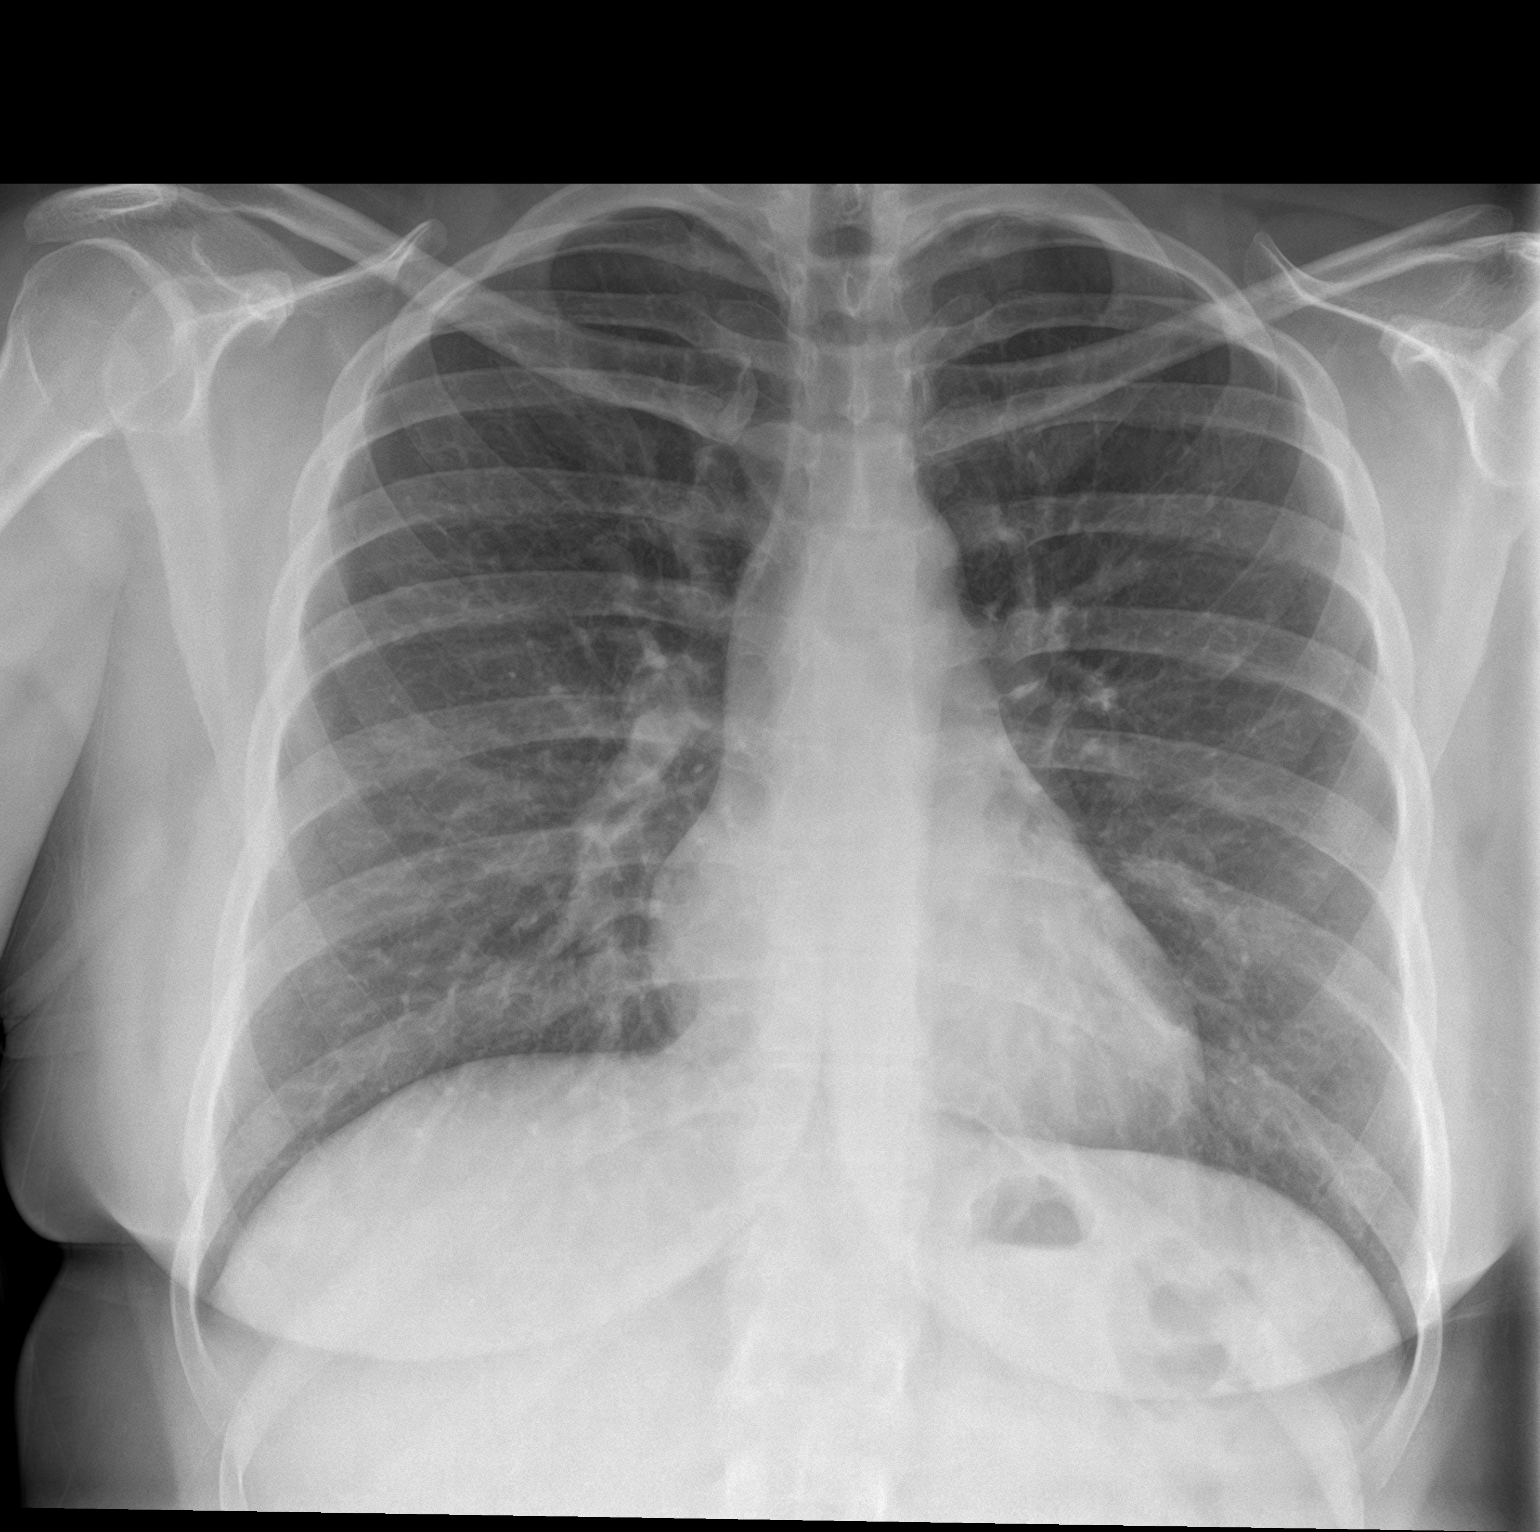

[chest lat]
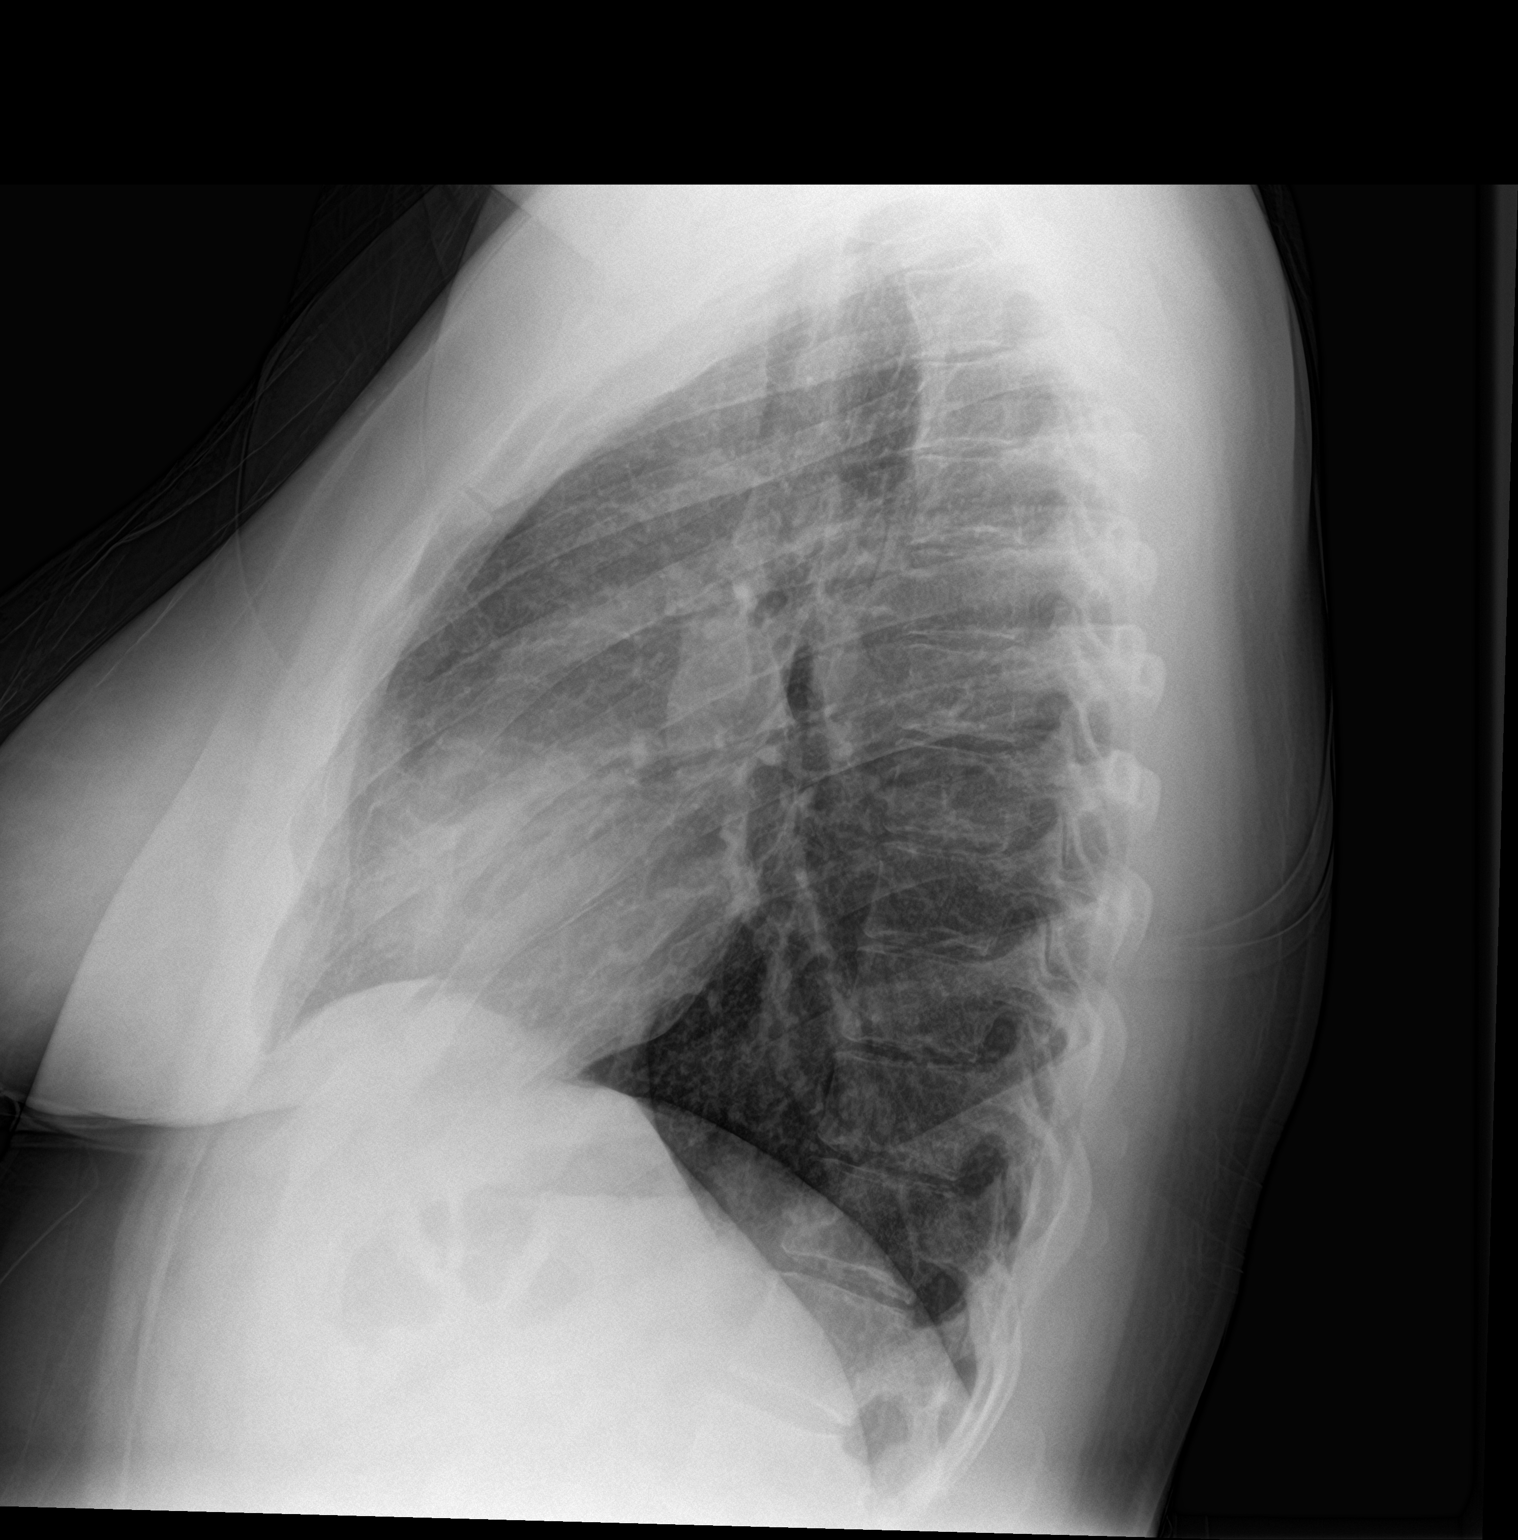

[2 of 2 positions shown; findings below may reference images not displayed]

FINDINGS: The heart size and mediastinal contours are within normal limits.
Both lungs are clear. The visualized skeletal structures are
unremarkable.
IMPRESSION: No active cardiopulmonary disease.

## 2021-11-14 ENCOUNTER — Encounter: Payer: Self-pay | Admitting: Family Medicine

## 2021-11-14 ENCOUNTER — Other Ambulatory Visit: Payer: Self-pay

## 2021-11-14 ENCOUNTER — Ambulatory Visit: Payer: BC Managed Care – PPO | Admitting: Family Medicine

## 2021-11-14 VITALS — BP 103/83 | HR 73 | Ht 72.0 in | Wt 207.3 lb

## 2021-11-14 DIAGNOSIS — R5383 Other fatigue: Secondary | ICD-10-CM

## 2021-11-14 DIAGNOSIS — K219 Gastro-esophageal reflux disease without esophagitis: Secondary | ICD-10-CM | POA: Diagnosis not present

## 2021-11-14 DIAGNOSIS — K602 Anal fissure, unspecified: Secondary | ICD-10-CM | POA: Diagnosis not present

## 2021-11-14 DIAGNOSIS — R109 Unspecified abdominal pain: Secondary | ICD-10-CM

## 2021-11-14 DIAGNOSIS — K582 Mixed irritable bowel syndrome: Secondary | ICD-10-CM

## 2021-11-14 DIAGNOSIS — F419 Anxiety disorder, unspecified: Secondary | ICD-10-CM

## 2021-11-14 NOTE — Progress Notes (Signed)
   I,Tammy Washington,acting as a Education administrator for [employer], DO.,have documented all relevant documentation on the behalf of [name], DO,as directed by  [name], DO while in the presence of [name], DO.   SUBJECTIVE:   CHIEF COMPLAINT / HPI:   ABDOMINAL ISSUES -Patient reports she has had stomach issues for about 3 years now and just would like to make sure everything has been tested. -Rectal bleeding every time she uses the restroom now for 3 weeks - PCP thought maybe stress related. - diarrhea constant, then constipation after about a year. - now with fissures, rectal bleeding, hemorrhoids for the past year - BRBPR with most defectiaon - +bloating, crmaping. Worse since being off prilosec - licorice supplement with each meal - pepcid helping - last colonoscopy 2019 with benign polyp - metamucil  Duration: chronic Nature: bloating, sharp, dull, aching, cramping, and ill-defined Location: mid quadrants, epigastric, periumblical  Frequency: constant Alleviating factors: pepcid, licorice supplement, prilosec Aggravating factors: unsure Treatments attempted: PPI and H2 Blocker Constipation: intermittent Diarrhea: yes Heartburn: no Bloating:yes Nausea: yes Vomiting: no Melena or hematochezia: yes Rash: no Jaundice: no Fever: no Weight loss: no   Anxiety - Medications: notriptyline, buspar prn - Taking: taking as prescribed. Was off nortriptyline for a few weeks, has been back on for about 1 month.  - Counseling: has attended one session - Previous hospitalizations: n/a - Symptoms: chest pressure and insomnia if things do get stressful - Current stressors: work - Coping Mechanisms: breathing and disney movies     11/14/2021    1:48 PM 09/13/2021   12:06 PM 08/15/2019   10:19 AM 06/17/2019   10:32 AM  GAD 7 : Generalized Anxiety Score  Nervous, Anxious, on Edge '2 3 2 3  '$ Control/stop worrying '2 3 2 3  '$ Worry too much - different things '2 3 2 3   '$ Trouble relaxing '2 3 2 3  '$ Restless '1 3 2 2  '$ Easily annoyed or irritable '3 3 2 3  '$ Afraid - awful might happen 2 2 0 2  Total GAD 7 Score '14 20 12 19  '$ Anxiety Difficulty Very difficult Very difficult Very difficult Very difficult     OBJECTIVE:   BP 103/83 (BP Location: Right Arm, Patient Position: Sitting, Cuff Size: Normal)   Pulse 73   Ht 6' (1.829 m)   Wt 207 lb 4.8 oz (94 kg)   SpO2 100%   BMI 28.11 kg/m   Gen: well appearing, in NAD Card: RRR Lungs: CTAB Abd: soft, slightly TTP throughout. Non distended. No organomegaly. +BS Ext: WWP, no edema   ASSESSMENT/PLAN:   Anxiety Improved GAD7 but still uncontrolled and likely contributing to IBS. Has only been a few weeks since back on nortriptyline. F/u in 2-4 weeks with repeat GAD7 scoring with dose increase if still not well controlled.    Irritable bowel syndrome with both constipation and diarrhea Chronic, not controlled. Has had some improvement with H2 blocker and PPI in the past, recommend restarting. Discussed low FODMAP diet. Will follow up in a few weeks to further assess anxiety response to nortriptyline, expect dose increase at that time if not controlled and hopefully aid IBS symptoms as well. Obtaining labs to assess for other contributors. Prefers establishment with different GI in [facility] system, referral placed.     [name], DO

## 2021-11-14 NOTE — Assessment & Plan Note (Signed)
Chronic, not controlled. Has had some improvement with H2 blocker and PPI in the past, recommend restarting. Discussed low FODMAP diet. Will follow up in a few weeks to further assess anxiety response to nortriptyline, expect dose increase at that time if not controlled and hopefully aid IBS symptoms as well. Obtaining labs to assess for other contributors. Prefers establishment with different GI in [facility] system, referral placed.

## 2021-11-14 NOTE — Assessment & Plan Note (Signed)
Improved GAD7 but still uncontrolled and likely contributing to IBS. Has only been a few weeks since back on nortriptyline. F/u in 2-4 weeks with repeat GAD7 scoring with dose increase if still not well controlled.

## 2021-11-15 ENCOUNTER — Telehealth: Payer: Self-pay

## 2021-11-15 NOTE — Telephone Encounter (Signed)
Copied from [place] [id number]. Topic: General - Other >> Nov 15, 2021 10:38 AM Everette C wrote: Reason for CRM: Patient has called to share that they will be dropping off their sample for testing today   The patient was uncertain if they were coming before or after lunch   Please contact further if needed

## 2021-11-15 NOTE — Telephone Encounter (Signed)
noted 

## 2021-11-16 LAB — CBC WITH DIFFERENTIAL/PLATELET
Basophils Absolute: 0.1 10*3/uL (ref 0.0–0.2)
Basos: 1 %
EOS (ABSOLUTE): 0.1 10*3/uL (ref 0.0–0.4)
Eos: 2 %
Hematocrit: 34.8 % (ref 34.0–46.6)
Hemoglobin: 11.3 g/dL (ref 11.1–15.9)
Immature Grans (Abs): 0 10*3/uL (ref 0.0–0.1)
Immature Granulocytes: 0 %
Lymphocytes Absolute: 1.6 10*3/uL (ref 0.7–3.1)
Lymphs: 26 %
MCH: 26.8 pg (ref 26.6–33.0)
MCHC: 32.5 g/dL (ref 31.5–35.7)
MCV: 83 fL (ref 79–97)
Monocytes Absolute: 0.4 10*3/uL (ref 0.1–0.9)
Monocytes: 7 %
Neutrophils Absolute: 3.9 10*3/uL (ref 1.4–7.0)
Neutrophils: 64 %
Platelets: 347 10*3/uL (ref 150–450)
RBC: 4.21 x10E6/uL (ref 3.77–5.28)
RDW: 15 % (ref 11.7–15.4)
WBC: 6.1 10*3/uL (ref 3.4–10.8)

## 2021-11-16 LAB — CELIAC DISEASE COMPREHENSIVE PANEL W REFLEXES, INFANT
Antigliadin Abs, IgA: 6 units (ref 0–19)
IgA/Immunoglobulin A, Serum: 399 mg/dL — ABNORMAL HIGH (ref 87–352)
Transglutaminase IgA: <2 U/mL (ref 0–3)

## 2021-11-16 LAB — COMPREHENSIVE METABOLIC PANEL
ALT: 10 IU/L (ref 0–32)
AST: 13 IU/L (ref 0–40)
Albumin/Globulin Ratio: 1.5 (ref 1.2–2.2)
Albumin: 4.4 g/dL (ref 4.0–5.0)
Alkaline Phosphatase: 57 IU/L (ref 44–121)
BUN/Creatinine Ratio: 17 (ref 9–23)
BUN: 13 mg/dL (ref 6–20)
Bilirubin Total: 0.2 mg/dL (ref 0.0–1.2)
CO2: 23 mmol/L (ref 20–29)
Calcium: 9.3 mg/dL (ref 8.7–10.2)
Chloride: 103 mmol/L (ref 96–106)
Creatinine, Ser: 0.76 mg/dL (ref 0.57–1.00)
Globulin, Total: 3 g/dL (ref 1.5–4.5)
Glucose: 90 mg/dL (ref 70–99)
Potassium: 4.4 mmol/L (ref 3.5–5.2)
Sodium: 139 mmol/L (ref 134–144)
Total Protein: 7.4 g/dL (ref 6.0–8.5)
eGFR: 109 mL/min/{1.73_m2} (ref >59)

## 2021-11-16 LAB — H. PYLORI BREATH TEST: H pylori Breath Test: NEGATIVE

## 2021-11-16 LAB — LIPASE: Lipase: 30 U/L (ref 14–72)

## 2021-11-16 LAB — VITAMIN B12: Vitamin B-12: 361 pg/mL (ref 232–1245)

## 2021-11-17 ENCOUNTER — Other Ambulatory Visit: Payer: Self-pay | Admitting: Family Medicine

## 2021-11-17 ENCOUNTER — Telehealth: Payer: Self-pay

## 2021-11-17 ENCOUNTER — Ambulatory Visit: Payer: Self-pay | Admitting: *Deleted

## 2021-11-17 LAB — GI PROFILE, STOOL, PCR

## 2021-11-17 LAB — CALPROTECTIN, FECAL: Calprotectin, Fecal: 202 ug/g — ABNORMAL HIGH (ref 0–120)

## 2021-11-17 MED ORDER — AZITHROMYCIN 500 MG PO TABS: 500.0000 mg | ORAL_TABLET | Freq: Every day | ORAL | 0 refills | Status: AC

## 2021-11-17 NOTE — Telephone Encounter (Signed)
Reason for Disposition  [1] Follow-up call to recent contact AND [2] information only call, no triage required    Lab and fecal results given  Answer Assessment - Initial Assessment Questions 1. REASON FOR CALL or QUESTION: "What is your reason for calling today?" or "How can I best help you?" or "What question do you have that I can help answer?"     Pt called in and was given her blood and fecal lab results.  The pharmacy had already notified her that the antibiotics were ready for pick up.  Protocols used: Information Only Call - No Triage-A-AH

## 2021-11-17 NOTE — Telephone Encounter (Signed)
Copied from [place] [id number]. Topic: Referral - Request for Referral >> Nov 17, 2021  2:19 PM Shiquita J wrote: Pt currently have a referral to [facility]. Pt would like to know if should could change it to [facility] instead?  Pt says that she would prefer to go there instead.

## 2021-11-18 ENCOUNTER — Ambulatory Visit: Payer: Self-pay

## 2021-11-18 NOTE — Telephone Encounter (Signed)
Pt called, she wanted to know if she should take the abx for E coli, she didn't really want to take it if she didn't have to. I advised her it was important to take the abx to help treat the infection. Pt also wanted to know if she can take Pepto if she needs it. I advised her that I didn't know of any reactions between Azithromycin and Pepto. Pt had another question but was unable to remember it. I advised her she could call back if needed and had other questions. She verbalized understanding.   Summary: medication concerns   Pt stated she recently had labs done and she she received a diagnosis. Pt requests to speak with a nurse to discuss her medications as she has concerns about the medications since receiving the diagnosis. Cb# [phone]      Reason for Disposition  Health Information question, no triage required and triager able to answer question  Answer Assessment - Initial Assessment Questions 1. REASON FOR CALL or QUESTION: "What is your reason for calling today?" or "How can I best help you?" or "What question do you have that I can help answer?"     Pt wanting to know if she should take abx or no for E. Coli, and if it's ok to take Pepto.  Protocols used: Information Only Call - No Triage-A-AH

## 2021-11-21 ENCOUNTER — Telehealth: Payer: Self-pay

## 2021-11-21 NOTE — Telephone Encounter (Signed)
Next available appt is Decemeber... Referral for abd discomfort... Pt recently Dx with E coli and completed Ax yesterday... Pt has Hx of hemorrhoids, anal fissure... Pt reports noticing blood in toilet... Denies rectal pain at this time, some nausea, denies vomiting... pt stated previous abd discomfort was epigastric region, but now x 2 weeks has been RLQ.... Pt has continued to take Metamucil BID...   Pt wanted to know if she should be seen sooner due to rectal bleeding, all other Sx have been ongoing chronic for years...   Please advise

## 2021-11-23 NOTE — Telephone Encounter (Signed)
Made appointment 12/22/2021

## 2021-12-22 ENCOUNTER — Encounter: Payer: Self-pay | Admitting: Gastroenterology

## 2021-12-22 ENCOUNTER — Ambulatory Visit: Payer: BC Managed Care – PPO | Admitting: Gastroenterology

## 2021-12-22 ENCOUNTER — Other Ambulatory Visit: Payer: Self-pay

## 2021-12-22 VITALS — BP 137/85 | HR 79 | Temp 98.5°F | Ht 72.0 in | Wt 207.0 lb

## 2021-12-22 DIAGNOSIS — R195 Other fecal abnormalities: Secondary | ICD-10-CM

## 2021-12-22 DIAGNOSIS — R1013 Epigastric pain: Secondary | ICD-10-CM

## 2021-12-22 NOTE — Progress Notes (Signed)
Tammy Darby, MD [address]  [place]  [place], [place] [address]  Main: [phone]  Fax: [phone]    Gastroenterology Consultation  Referring Provider:     Virginia Crews, MD Primary Care Physician:  Virginia Crews, MD Primary Gastroenterologist:  Dr. Cephas Washington Reason for Consultation: Rectal bleeding, rectal pain        HPI:   Tammy Washington is a 31 y.o. female referred by Dr. Brita Romp, Dionne Bucy, MD  for consultation & management of rectal bleeding and rectal pain.  Patient reports that she has been experiencing GI symptoms since onset of pandemic.  She originally underwent colonoscopy in 11/2017 secondary to family history of colon adenoma in her sister.  Patient's colonoscopy was unremarkable.  Since onset of COVID-19 pandemic, patient experienced diarrhea for about a year.  Followed by that she started experiencing constipation associated with recurrent episodes of severe rectal pain which she thought was an anal fissure associated with intermittent episodes of rectal bleeding.  She introduced fiber supplement daily which helped to keep her bowels more formed, describes on Bristol stool scale as 2-4.  When she does not take fiber, her stools are hard pellets.  In July 2023, patient had diarrheal episode, was seen by her PCP, found to have elevated fecal calprotectin levels as well as E. coli which was treated.  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  She also states that she is very anxious generally.  She also reports epigastric discomfort.  Patient denies any abdominal bloating, nausea, vomiting.  She reports bright red blood per rectum on wiping, in the toilet bowel and sometimes on the surface of the stool.  She wants to make sure that she does not have any colorectal cancer.  Patient went on a gluten-free diet because of her GI symptoms, does not notice much  difference.  Her celiac panel was negative, H. pylori breath test was negative, CBC, CMP, B12, TSH were unremarkable  Patient does not smoke or drink alcohol, denies any marijuana use or vaping  Patient denies any family history of IBD  NSAIDs: None  Antiplts/Anticoagulants/Anti thrombotics: None  GI Procedures:  Colonoscopy 12/12/2017 - The examined portion of the ileum was normal. - The entire examined colon is normal. - The distal rectum and anal verge are normal on retroflexion view. - No specimens collected.  Past Medical History:  Diagnosis Date   Allergy    COVID-19 09/2020   H/O colonoscopy    Migraine headache     Past Surgical History:  Procedure Laterality Date   COLONOSCOPY WITH PROPOFOL N/A 12/12/2017   Procedure: COLONOSCOPY WITH PROPOFOL;  Surgeon: Lin Landsman, MD;  Location: [facility];  Service: Endoscopy;  Laterality: N/A;   NO PAST SURGERIES       Current Outpatient Medications:    famotidine (PEPCID) 20 MG tablet, Start famotidine 40 mg, PO, twice daily for the next 7 days then take 20 mg as needed twice daily., Disp: 60 tablet, Rfl: 0   nortriptyline (PAMELOR) 10 MG capsule, Take 1 capsule (10 mg total) by mouth at bedtime., Disp: 90 capsule, Rfl: 0   Family History  Problem Relation Age of Onset   Healthy Mother    Rheum arthritis Father    Colon polyps Sister 84       serrated adenoma   Healthy Sister  Diabetes type I Paternal Aunt    Dementia Maternal Grandmother    Colon cancer Neg Hx    Breast cancer Neg Hx    Ovarian cancer Neg Hx    Cervical cancer Neg Hx      Social History   Tobacco Use   Smoking status: Former    Years: 3.00    Types: Cigarettes    Quit date: 04/24/2015    Years since quitting: 6.6   Smokeless tobacco: Never   Tobacco comments:    former social smoker  Vaping Use   Vaping Use: Never used  Substance Use Topics   Alcohol use: Yes    Alcohol/week: 1.0 - 2.0 standard drink of alcohol     Types: 1 - 2 Cans of beer per week   Drug use: Never    Allergies as of 12/22/2021   (No Known Allergies)    Review of Systems:    All systems reviewed and negative except where noted in HPI.   Physical Exam:  BP 137/85 (BP Location: Left Arm, Patient Position: Sitting, Cuff Size: Normal)   Pulse 79   Temp 98.5 F (36.9 C) (Oral)   Ht 6' (1.829 m)   Wt 207 lb (93.9 kg)   BMI 28.07 kg/m  No LMP recorded.  General:   Alert,  Well-developed, well-nourished, pleasant and cooperative in NAD Head:  Normocephalic and atraumatic. Eyes:  Sclera clear, no icterus.   Conjunctiva pink. Ears:  Normal auditory acuity. Nose:  No deformity, discharge, or lesions. Mouth:  No deformity or lesions,oropharynx pink & moist. Neck:  Supple; no masses or thyromegaly. Lungs:  Respirations even and unlabored.  Clear throughout to auscultation.   No wheezes, crackles, or rhonchi. No acute distress. Heart:  Regular rate and rhythm; no murmurs, clicks, rubs, or gallops. Abdomen:  Normal bowel sounds. Soft, non-tender and non-distended without masses, hepatosplenomegaly or hernias noted.  No guarding or rebound tenderness.   Rectal: Tender spot on the posterior wall of the anal canal on at the entry Msk:  Symmetrical without gross deformities. Good, equal movement & strength bilaterally. Pulses:  Normal pulses noted. Extremities:  No clubbing or edema.  No cyanosis. Neurologic:  Alert and oriented x3;  grossly normal neurologically. Skin:  Intact without significant lesions or rashes. No jaundice. Psych:  Alert and cooperative. Normal mood and affect.  Imaging Studies: Right upper quadrant ultrasound in 06/2020 normal exam  Assessment and Plan:   [name] is a 31 y.o. pleasant Caucasian female with history of anxiety is seen in consultation for severe rectal pain, rectal bleeding, prior history of diarrhea, now with constipation  Rectal pain Rectal exam consistent with posterior anal  fissure Discussed about trial of nitroglycerin with lidocaine, patient agreed, instructions provided Recommend 0.125% nitroglycerin with 5% lidocaine  Bright red blood per rectum Unclear etiology, either bleeding from anal fissure or hemorrhoids or proctitis Recommend flexible sigmoidoscopy  Patient also had elevated fecal calprotectin levels on November 15, 2021, she had E. coli infection at that time.  Fecal calprotectin levels can be elevated secondary to infection or inflammation Recheck fecal calprotectin levels  Chronic constipation Continue fiber supplements Reiterated on high-fiber diet and adequate intake of water Start MiraLAX 1 capful daily  Epigastric pain Recommend EGD for further evaluation with gastric and duodenal biopsies   Follow up in 2 to 3 months   Tammy Darby, MD

## 2021-12-22 NOTE — Patient Instructions (Signed)
[facility] [address], [place] [address].   Phone number [phone].   Please allow at least 24 hours before picking up the compounded cream because the pharmacy has to make the medication.  

## 2022-01-11 ENCOUNTER — Encounter: Payer: Self-pay | Admitting: Gastroenterology

## 2022-01-12 NOTE — Anesthesia Preprocedure Evaluation (Addendum)
Anesthesia Evaluation  Patient identified by MRN, date of birth, ID band Patient awake    Reviewed: Allergy & Precautions, NPO status , Patient's Chart, lab work & pertinent test results  Airway Mallampati: II  TM Distance: >3 FB Neck ROM: full    Dental no notable dental hx.    Pulmonary neg pulmonary ROS, Patient abstained from smoking., former smoker,    Pulmonary exam normal        Cardiovascular negative cardio ROS Normal cardiovascular exam     Neuro/Psych  Headaches, negative psych ROS   GI/Hepatic Neg liver ROS, GERD  Controlled and Medicated,  Endo/Other  negative endocrine ROS  Renal/GU negative Renal ROS  negative genitourinary   Musculoskeletal   Abdominal Normal abdominal exam  (+)   Peds  Hematology negative hematology ROS (+)   Anesthesia Other Findings Past Medical History: No date: Allergy 09/2020: COVID-19 No date: H/O colonoscopy No date: Migraine headache  Past Surgical History: 12/12/2017: COLONOSCOPY WITH PROPOFOL; N/A     Comment:  Procedure: COLONOSCOPY WITH PROPOFOL;  Surgeon: Lin Landsman, MD;  Location: [place];                Service: Endoscopy;  Laterality: N/A;  BMI    Body Mass Index: 28.07 kg/m      Reproductive/Obstetrics negative OB ROS                            Anesthesia Physical Anesthesia Plan  ASA: 2  Anesthesia Plan: General   Post-op Pain Management: Minimal or no pain anticipated   Induction: Intravenous  PONV Risk Score and Plan: Propofol infusion and TIVA  Airway Management Planned: Natural Airway  Additional Equipment:   Intra-op Plan:   Post-operative Plan:   Informed Consent: I have reviewed the patients History and Physical, chart, labs and discussed the procedure including the risks, benefits and alternatives for the proposed anesthesia with the patient or authorized representative  who has indicated his/her understanding and acceptance.     Dental Advisory Given  Plan Discussed with: Anesthesiologist, CRNA and Surgeon  Anesthesia Plan Comments:        Anesthesia Quick Evaluation

## 2022-01-17 ENCOUNTER — Encounter: Payer: Self-pay | Admitting: Gastroenterology

## 2022-01-17 ENCOUNTER — Ambulatory Visit: Payer: BC Managed Care – PPO | Admitting: Anesthesiology

## 2022-01-17 ENCOUNTER — Other Ambulatory Visit: Payer: Self-pay

## 2022-01-17 ENCOUNTER — Encounter
Admission: RE | Disposition: A | Discharge status: Home / Self Care | Payer: Self-pay | Source: Home / Self Care | Attending: Gastroenterology

## 2022-01-17 ENCOUNTER — Ambulatory Visit
Admission: RE | Admit: 2022-01-17 | Discharge: 2022-01-17 | Disposition: A | Discharge status: Home / Self Care | Payer: BC Managed Care – PPO | Attending: Gastroenterology | Admitting: Gastroenterology

## 2022-01-17 DIAGNOSIS — Z87891 Personal history of nicotine dependence: Secondary | ICD-10-CM | POA: Insufficient documentation

## 2022-01-17 DIAGNOSIS — K6289 Other specified diseases of anus and rectum: Secondary | ICD-10-CM | POA: Diagnosis not present

## 2022-01-17 DIAGNOSIS — D124 Benign neoplasm of descending colon: Secondary | ICD-10-CM | POA: Diagnosis not present

## 2022-01-17 DIAGNOSIS — K625 Hemorrhage of anus and rectum: Secondary | ICD-10-CM | POA: Diagnosis not present

## 2022-01-17 DIAGNOSIS — K219 Gastro-esophageal reflux disease without esophagitis: Secondary | ICD-10-CM | POA: Insufficient documentation

## 2022-01-17 DIAGNOSIS — R1013 Epigastric pain: Secondary | ICD-10-CM | POA: Diagnosis not present

## 2022-01-17 DIAGNOSIS — K635 Polyp of colon: Secondary | ICD-10-CM | POA: Diagnosis not present

## 2022-01-17 DIAGNOSIS — R195 Other fecal abnormalities: Secondary | ICD-10-CM

## 2022-01-17 HISTORY — PX: ESOPHAGOGASTRODUODENOSCOPY (EGD) WITH PROPOFOL: SHX5813

## 2022-01-17 HISTORY — PX: POLYPECTOMY: SHX5525

## 2022-01-17 HISTORY — PX: FLEXIBLE SIGMOIDOSCOPY: SHX5431

## 2022-01-17 LAB — POCT PREGNANCY, URINE: Preg Test, Ur: NEGATIVE

## 2022-01-17 SURGERY — SIGMOIDOSCOPY, FLEXIBLE
Anesthesia: General | Site: Rectum

## 2022-01-17 MED ORDER — LACTATED RINGERS IV SOLN: INTRAVENOUS | Status: DC

## 2022-01-17 MED ORDER — GLYCOPYRROLATE 0.2 MG/ML IJ SOLN
INTRAMUSCULAR | Status: DC | PRN
  Administered 2022-01-17: .1 mg via INTRAVENOUS

## 2022-01-17 MED ORDER — DEXMEDETOMIDINE HCL IN NACL 200 MCG/50ML IV SOLN
INTRAVENOUS | Status: DC | PRN
  Administered 2022-01-17: 10 ug via INTRAVENOUS

## 2022-01-17 MED ORDER — LIDOCAINE HCL (CARDIAC) PF 100 MG/5ML IV SOSY
PREFILLED_SYRINGE | INTRAVENOUS | Status: DC | PRN
  Administered 2022-01-17: 100 mg via INTRAVENOUS

## 2022-01-17 MED ORDER — STERILE WATER FOR IRRIGATION IR SOLN
Status: DC | PRN
  Administered 2022-01-17 (×2): 100 mL

## 2022-01-17 MED ORDER — SODIUM CHLORIDE 0.9 % IV SOLN: INTRAVENOUS | Status: DC

## 2022-01-17 MED ORDER — PROPOFOL 10 MG/ML IV BOLUS
INTRAVENOUS | Status: DC | PRN
  Administered 2022-01-17 (×2): 40 mg via INTRAVENOUS
  Administered 2022-01-17 (×2): 50 mg via INTRAVENOUS
  Administered 2022-01-17: 70 mg via INTRAVENOUS
  Administered 2022-01-17 (×3): 50 mg via INTRAVENOUS

## 2022-01-17 SURGICAL SUPPLY — 10 items
BLOCK BITE 60FR ADLT L/F GRN (MISCELLANEOUS) ×2 IMPLANT
FORCEPS BIOP RAD 4 LRG CAP 4 (CUTTING FORCEPS) IMPLANT
GOWN CVR UNV OPN BCK APRN NK (MISCELLANEOUS) ×4 IMPLANT
GOWN ISOL THUMB LOOP REG UNIV (MISCELLANEOUS) ×4
KIT PRC NS LF DISP ENDO (KITS) ×2 IMPLANT
KIT PROCEDURE OLYMPUS (KITS) ×2
MANIFOLD NEPTUNE II (INSTRUMENTS) ×2 IMPLANT
SNARE COLD EXACTO (MISCELLANEOUS) IMPLANT
TRAP ETRAP POLY (MISCELLANEOUS) IMPLANT
WATER STERILE IRR 250ML POUR (IV SOLUTION) ×2 IMPLANT

## 2022-01-17 NOTE — Op Note (Addendum)
[facility] Patient Name: Tammy Washington Procedure Date: 01/17/2022 9:46 AM MRN: [id number] Account #: [id number] Date of Birth: Sep 07, 1990 Admit Type: Outpatient Age: 31 Room: [facility] OR ROOM 01 Gender: Female Note Status: Finalized Instrument Name: [id number] Procedure:             Upper GI endoscopy Indications:           Epigastric abdominal pain Providers:             Lin Landsman MD, MD Referring MD:          Cletis Athens, MD (Referring MD) Medicines:             General Anesthesia Complications:         No immediate complications. Estimated blood loss: None. Procedure:             Pre-Anesthesia Assessment:                        - Prior to the procedure, a History and Physical was                         performed, and patient medications and allergies were                         reviewed. The patient is competent. The risks and                         benefits of the procedure and the sedation options and                         risks were discussed with the patient. All questions                         were answered and informed consent was obtained.                         Patient identification and proposed procedure were                         verified by the physician, the nurse, the                         anesthesiologist, the anesthetist and the technician                         in the pre-procedure area in the procedure room in the                         endoscopy suite. Mental Status Examination: alert and                         oriented. Airway Examination: normal oropharyngeal                         airway and neck mobility. Respiratory Examination:                         clear to auscultation. CV Examination: normal.  Prophylactic Antibiotics: The patient does not require                         prophylactic antibiotics. Prior Anticoagulants: The                         patient has taken no  previous anticoagulant or                         antiplatelet agents. ASA Grade Assessment: II - A                         patient with mild systemic disease. After reviewing                         the risks and benefits, the patient was deemed in                         satisfactory condition to undergo the procedure. The                         anesthesia plan was to use general anesthesia.                         Immediately prior to administration of medications,                         the patient was re-assessed for adequacy to receive                         sedatives. The heart rate, respiratory rate, oxygen                         saturations, blood pressure, adequacy of pulmonary                         ventilation, and response to care were monitored                         throughout the procedure. The physical status of the                         patient was re-assessed after the procedure.                        After obtaining informed consent, the endoscope was                         passed under direct vision. Throughout the procedure,                         the patient's blood pressure, pulse, and oxygen                         saturations were monitored continuously. The was                         introduced through the mouth, and advanced to the  second part of duodenum. The upper GI endoscopy was                         accomplished without difficulty. The patient tolerated                         the procedure well. Findings:      The duodenal bulb and second portion of the duodenum were normal.       Biopsies were taken with a cold forceps for histology.      The entire examined stomach was normal. Biopsies were taken with a cold       forceps for Helicobacter pylori testing.      The gastroesophageal junction and examined esophagus were normal. Impression:            - Normal duodenal bulb and second portion of the                          duodenum. Biopsied.                        - Normal stomach. Biopsied.                        - Normal gastroesophageal junction and esophagus. Recommendation:        - Await pathology results. Procedure Code(s):     --- Professional ---                        [phone], Esophagogastroduodenoscopy, flexible,                         transoral; with biopsy, single or multiple Diagnosis Code(s):     --- Professional ---                        R10.13, Epigastric pain CPT copyright 2019 American Medical Association. All rights reserved. The codes documented in this report are preliminary and upon coder review may  be revised to meet current compliance requirements. Dr. Ulyess Mort Lin Landsman MD, MD 01/17/2022 10:08:16 AM This report has been signed electronically. Number of Addenda: 0 Note Initiated On: 01/17/2022 9:46 AM Total Procedure Duration: 0 hours 5 minutes 39 seconds  Estimated Blood Loss:  Estimated blood loss: none.      [facility]

## 2022-01-17 NOTE — H&P (Signed)
Cephas Darby, MD [address]  [place]  [place], [place] [address]  Main: [phone]  Fax: [phone] Pager: [phone]  Primary Care Physician:  Virginia Crews, MD Primary Gastroenterologist:  Dr. Cephas Darby  Pre-Procedure History & Physical: HPI:  Tammy Washington is a 31 y.o. female is here for an endoscopy and flexible sigmoidoscopy.   Past Medical History:  Diagnosis Date   Allergy    COVID-19 09/2020   H/O colonoscopy    Migraine headache     Past Surgical History:  Procedure Laterality Date   COLONOSCOPY WITH PROPOFOL N/A 12/12/2017   Procedure: COLONOSCOPY WITH PROPOFOL;  Surgeon: Lin Landsman, MD;  Location: [place];  Service: Endoscopy;  Laterality: N/A;    Prior to Admission medications   Medication Sig Start Date End Date Taking? Authorizing Provider  Probiotic Product (PROBIOTIC PO) Take by mouth daily.   Yes [provider]  promethazine (PHENERGAN) 25 MG tablet Take 25 mg by mouth every 6 (six) hours as needed for nausea or vomiting.   Yes [provider]  famotidine (PEPCID) 20 MG tablet Start famotidine 40 mg, PO, twice daily for the next 7 days then take 20 mg as needed twice daily. Patient not taking: Reported on 01/11/2022 03/14/21   Scot Jun, FNP  nortriptyline (PAMELOR) 10 MG capsule Take 1 capsule (10 mg total) by mouth at bedtime. 09/13/21 12/22/21  [name], DO    Allergies as of 12/22/2021   (No Known Allergies)    Family History  Problem Relation Age of Onset   Healthy Mother    Rheum arthritis Father    Colon polyps Sister 47       serrated adenoma   Healthy Sister    Diabetes type I Paternal Aunt    Dementia Maternal Grandmother    Colon cancer Neg Hx    Breast cancer Neg Hx    Ovarian cancer Neg Hx    Cervical cancer Neg Hx     Social History   Socioeconomic History   Marital status: Single    Spouse name: Not on file   Number of children: 0    Years of education: 16   Highest education level: Bachelor's degree (e.g., BA, AB, BS)  Occupational History   Occupation: Optometrist at [employer]: [employer]  Tobacco Use   Smoking status: Former    Years: 3.00    Types: Cigarettes    Quit date: 04/24/2015    Years since quitting: 6.7   Smokeless tobacco: Never   Tobacco comments:    former social smoker  Vaping Use   Vaping Use: Never used  Substance and Sexual Activity   Alcohol use: Yes    Alcohol/week: 1.0 - 2.0 standard drink of alcohol    Types: 1 - 2 Cans of beer per week   Drug use: Never   Sexual activity: Not Currently    Partners: Male    Birth control/protection: Abstinence  Other Topics Concern   Not on file  Social History Narrative   Not on file   Social Determinants of Health   Financial Resource Strain: Not on file  Food Insecurity: Not on file  Transportation Needs: Not on file  Physical Activity: Not on file  Stress: Not on file  Social Connections: Not on file  Intimate Partner Violence: Not on file    Review of Systems: See HPI, otherwise negative ROS  Physical Exam: BP  121/84   Temp 98.4 F (36.9 C) (Tympanic)   Resp (!) 22   Ht 6' 0.01" (1.829 m)   Wt 93.6 kg   LMP 12/31/2021 (Approximate)   SpO2 100%   BMI 27.99 kg/m  General:   Alert,  pleasant and cooperative in NAD Head:  Normocephalic and atraumatic. Neck:  Supple; no masses or thyromegaly. Lungs:  Clear throughout to auscultation.    Heart:  Regular rate and rhythm. Abdomen:  Soft, nontender and nondistended. Normal bowel sounds, without guarding, and without rebound.   Neurologic:  Alert and  oriented x4;  grossly normal neurologically.  Impression/Plan: Tammy Washington is here for an endoscopy and flexible sigmoidoscopy to be performed for epigastric pain, rectal pain and rectal bleeding  Risks, benefits, limitations, and alternatives regarding  endoscopy and flexible sigmoidoscopy have been reviewed with  the patient.  Questions have been answered.  All parties agreeable.   Sherri Sear, MD  01/17/2022, 8:57 AM

## 2022-01-17 NOTE — Anesthesia Postprocedure Evaluation (Signed)
Anesthesia Post Note  Patient: Tammy Washington  Procedure(s) Performed: FLEXIBLE SIGMOIDOSCOPY WITH BIOPSY (Rectum) ESOPHAGOGASTRODUODENOSCOPY (EGD) WITH BIOPSIES (Mouth) POLYPECTOMY (Rectum)     Patient location during evaluation: PACU Anesthesia Type: General Level of consciousness: awake and alert Pain management: pain level controlled Vital Signs Assessment: post-procedure vital signs reviewed and stable Respiratory status: spontaneous breathing, nonlabored ventilation and respiratory function stable Cardiovascular status: blood pressure returned to baseline and stable Postop Assessment: no apparent nausea or vomiting Anesthetic complications: no   There were no known notable events for this encounter.  [place] Ouch

## 2022-01-17 NOTE — Op Note (Signed)
[facility] Patient Name: Tammy Washington Procedure Date: 01/17/2022 9:45 AM MRN: [id number] Account #: [id number] Date of Birth: 30-Dec-1990 Admit Type: Outpatient Age: 31 Room: [facility] OR ROOM 01 Gender: Female Note Status: Finalized Instrument Name: [id number] Procedure:             Flexible Sigmoidoscopy Indications:           Rectal hemorrhage, Rectal pain Providers:             Lin Landsman MD, MD Referring MD:          Cletis Athens, MD (Referring MD) Medicines:             General Anesthesia Complications:         No immediate complications. Estimated blood loss: None. Procedure:             Pre-Anesthesia Assessment:                        - Prior to the procedure, a History and Physical was                         performed, and patient medications and allergies were                         reviewed. The patient is competent. The risks and                         benefits of the procedure and the sedation options and                         risks were discussed with the patient. All questions                         were answered and informed consent was obtained.                         Patient identification and proposed procedure were                         verified by the physician, the nurse, the                         anesthesiologist, the anesthetist and the technician                         in the pre-procedure area in the procedure room in the                         endoscopy suite. Mental Status Examination: alert and                         oriented. Airway Examination: normal oropharyngeal                         airway and neck mobility. Respiratory Examination:                         clear to auscultation. CV Examination: normal.  Prophylactic Antibiotics: The patient does not require                         prophylactic antibiotics. Prior Anticoagulants: The                         patient has  taken no previous anticoagulant or                         antiplatelet agents. ASA Grade Assessment: II - A                         patient with mild systemic disease. After reviewing                         the risks and benefits, the patient was deemed in                         satisfactory condition to undergo the procedure. The                         anesthesia plan was to use general anesthesia.                         Immediately prior to administration of medications,                         the patient was re-assessed for adequacy to receive                         sedatives. The heart rate, respiratory rate, oxygen                         saturations, blood pressure, adequacy of pulmonary                         ventilation, and response to care were monitored                         throughout the procedure. The physical status of the                         patient was re-assessed after the procedure.                        After obtaining informed consent, the scope was passed                         under direct vision. The was introduced through the                         anus and advanced to the the descending colon. The                         flexible sigmoidoscopy was accomplished without                         difficulty. The patient tolerated the procedure well.  The quality of the bowel preparation was fair. Findings:      The perianal and digital rectal examinations were normal. Pertinent       negatives include normal sphincter tone and no palpable rectal lesions.      A 5 mm polyp was found in the descending colon. The polyp was sessile.       The polyp was removed with a cold snare. Resection and retrieval were       complete. Estimated blood loss: none.      The rectum (on retroflexion) appeared normal. Impression:            - Preparation of the colon was fair.                        - One 5 mm polyp in the descending colon, removed  with                         a cold snare. Resected and retrieved.                        - The rectum is normal. Recommendation:        - Discharge patient to home (with escort).                        - Resume regular diet and high fiber diet today.                        - Await pathology results.                        - Perform a colonoscopy based on the polyp pathology                         results. Procedure Code(s):     --- Professional ---                        [phone], Sigmoidoscopy, flexible; with removal of                         tumor(s), polyp(s), or other lesion(s) by snare                         technique Diagnosis Code(s):     --- Professional ---                        K63.5, Polyp of colon                        K62.5, Hemorrhage of anus and rectum                        K62.89, Other specified diseases of anus and rectum CPT copyright 2019 American Medical Association. All rights reserved. The codes documented in this report are preliminary and upon coder review may  be revised to meet current compliance requirements. Dr. Ulyess Mort Lin Landsman MD, MD 01/17/2022 10:25:03 AM This report has been signed electronically. Number of Addenda: 0 Note Initiated On: 01/17/2022 9:45 AM Total Procedure Duration: 0 hours 12 minutes 25 seconds  Estimated Blood  Loss:  Estimated blood loss: none.      [facility]

## 2022-01-17 NOTE — Transfer of Care (Signed)
Immediate Anesthesia Transfer of Care Note  Patient: Tammy Washington  Procedure(s) Performed: FLEXIBLE SIGMOIDOSCOPY WITH BIOPSY (Rectum) ESOPHAGOGASTRODUODENOSCOPY (EGD) WITH BIOPSIES (Mouth) POLYPECTOMY (Rectum)  Patient Location: PACU  Anesthesia Type: General  Level of Consciousness: awake, alert  and patient cooperative  Airway and Oxygen Therapy: Patient Spontanous Breathing and Patient connected to supplemental oxygen  Post-op Assessment: Post-op Vital signs reviewed, Patient's Cardiovascular Status Stable, Respiratory Function Stable, Patent Airway and No signs of Nausea or vomiting  Post-op Vital Signs: Reviewed and stable  Complications: There were no known notable events for this encounter.

## 2022-01-18 ENCOUNTER — Encounter: Payer: Self-pay | Admitting: Gastroenterology

## 2022-01-19 ENCOUNTER — Encounter: Payer: Self-pay | Admitting: Gastroenterology

## 2022-01-19 LAB — SURGICAL PATHOLOGY

## 2022-01-20 ENCOUNTER — Telehealth: Payer: Self-pay

## 2022-01-20 NOTE — Telephone Encounter (Signed)
-----   Message from Lin Landsman, MD sent at 01/19/2022  4:37 PM EDT ----- Tammy Washington  Patient will need colonoscopy because of adenomatous polyp on sigmoidoscopy.  I sent her message on my chart  Rohini Vanga

## 2022-01-20 NOTE — Telephone Encounter (Signed)
Called and left a message for call back  

## 2022-01-23 NOTE — Telephone Encounter (Signed)
Called and left a message for call back  

## 2022-01-24 ENCOUNTER — Other Ambulatory Visit: Payer: Self-pay

## 2022-01-24 DIAGNOSIS — D369 Benign neoplasm, unspecified site: Secondary | ICD-10-CM

## 2022-01-24 MED ORDER — NA SULFATE-K SULFATE-MG SULF 17.5-3.13-1.6 GM/177ML PO SOLN: 354.0000 mL | Freq: Once | ORAL | 0 refills | Status: AC

## 2022-01-24 NOTE — Telephone Encounter (Signed)
Called patient and patient schedule colonoscopy for 01/27/2022. Sent instructions to mychart and went over instructions. Sent prep to the pharmacy

## 2022-01-27 ENCOUNTER — Other Ambulatory Visit: Payer: Self-pay

## 2022-01-27 ENCOUNTER — Encounter
Admission: RE | Disposition: A | Discharge status: Home / Self Care | Payer: Self-pay | Source: Home / Self Care | Attending: Gastroenterology

## 2022-01-27 ENCOUNTER — Ambulatory Visit
Admission: RE | Admit: 2022-01-27 | Discharge: 2022-01-27 | Disposition: A | Discharge status: Home / Self Care | Payer: BC Managed Care – PPO | Attending: Gastroenterology | Admitting: Gastroenterology

## 2022-01-27 ENCOUNTER — Encounter: Payer: Self-pay | Admitting: Gastroenterology

## 2022-01-27 ENCOUNTER — Ambulatory Visit: Payer: BC Managed Care – PPO | Admitting: Certified Registered"

## 2022-01-27 DIAGNOSIS — Z87891 Personal history of nicotine dependence: Secondary | ICD-10-CM | POA: Diagnosis not present

## 2022-01-27 DIAGNOSIS — K5730 Diverticulosis of large intestine without perforation or abscess without bleeding: Secondary | ICD-10-CM | POA: Diagnosis not present

## 2022-01-27 DIAGNOSIS — Z83719 Family history of colon polyps, unspecified: Secondary | ICD-10-CM | POA: Insufficient documentation

## 2022-01-27 DIAGNOSIS — K635 Polyp of colon: Secondary | ICD-10-CM | POA: Diagnosis not present

## 2022-01-27 DIAGNOSIS — D369 Benign neoplasm, unspecified site: Secondary | ICD-10-CM

## 2022-01-27 DIAGNOSIS — Z86010 Personal history of colonic polyps: Secondary | ICD-10-CM | POA: Insufficient documentation

## 2022-01-27 DIAGNOSIS — Z1211 Encounter for screening for malignant neoplasm of colon: Secondary | ICD-10-CM | POA: Diagnosis not present

## 2022-01-27 HISTORY — PX: COLONOSCOPY WITH PROPOFOL: SHX5780

## 2022-01-27 LAB — BASIC METABOLIC PANEL
Anion gap: 3 — ABNORMAL LOW (ref 5–15)
BUN: 8 mg/dL (ref 6–20)
CO2: 23 mmol/L (ref 22–32)
Calcium: 8.1 mg/dL — ABNORMAL LOW (ref 8.9–10.3)
Chloride: 111 mmol/L (ref 98–111)
Creatinine, Ser: 0.65 mg/dL (ref 0.44–1.00)
GFR, Estimated: >60 mL/min (ref >60)
Glucose, Bld: 93 mg/dL (ref 70–99)
Potassium: 3.5 mmol/L (ref 3.5–5.1)
Sodium: 137 mmol/L (ref 135–145)

## 2022-01-27 LAB — PHOSPHORUS: Phosphorus: 3.5 mg/dL (ref 2.5–4.6)

## 2022-01-27 LAB — POCT PREGNANCY, URINE: Preg Test, Ur: NEGATIVE

## 2022-01-27 LAB — MAGNESIUM: Magnesium: 1.9 mg/dL (ref 1.7–2.4)

## 2022-01-27 SURGERY — COLONOSCOPY WITH PROPOFOL
Anesthesia: General

## 2022-01-27 MED ORDER — SODIUM CHLORIDE 0.9 % IV SOLN: INTRAVENOUS | Status: DC

## 2022-01-27 MED ORDER — PROPOFOL 10 MG/ML IV BOLUS
INTRAVENOUS | Status: DC | PRN
  Administered 2022-01-27: 30 mg via INTRAVENOUS
  Administered 2022-01-27: 100 mg via INTRAVENOUS
  Administered 2022-01-27: 40 mg via INTRAVENOUS

## 2022-01-27 MED ORDER — PROPOFOL 500 MG/50ML IV EMUL
INTRAVENOUS | Status: DC | PRN
  Administered 2022-01-27: 150 ug/kg/min via INTRAVENOUS

## 2022-01-27 MED ORDER — PROPOFOL 1000 MG/100ML IV EMUL
INTRAVENOUS | Status: AC
  Filled 2022-01-27: qty 100

## 2022-01-27 MED ORDER — LIDOCAINE HCL (CARDIAC) PF 100 MG/5ML IV SOSY
PREFILLED_SYRINGE | INTRAVENOUS | Status: DC | PRN
  Administered 2022-01-27: 50 mg via INTRAVENOUS

## 2022-01-27 NOTE — Anesthesia Preprocedure Evaluation (Signed)
Anesthesia Evaluation  Patient identified by MRN, date of birth, ID band Patient awake    Reviewed: Allergy & Precautions, NPO status , Patient's Chart, lab work & pertinent test results  Airway Mallampati: II  TM Distance: >3 FB Neck ROM: full    Dental no notable dental hx.    Pulmonary neg pulmonary ROS, Patient abstained from smoking., former smoker,    Pulmonary exam normal        Cardiovascular negative cardio ROS Normal cardiovascular exam     Neuro/Psych  Headaches, negative psych ROS   GI/Hepatic Neg liver ROS, GERD  Controlled and Medicated,  Endo/Other  negative endocrine ROS  Renal/GU negative Renal ROS  negative genitourinary   Musculoskeletal   Abdominal Normal abdominal exam  (+)   Peds  Hematology negative hematology ROS (+)   Anesthesia Other Findings Past Medical History: No date: Allergy 09/2020: COVID-19 No date: H/O colonoscopy No date: Migraine headache  Past Surgical History: 12/12/2017: COLONOSCOPY WITH PROPOFOL; N/A     Comment:  Procedure: COLONOSCOPY WITH PROPOFOL;  Surgeon: Lin Landsman, MD;  Location: [place];                Service: Endoscopy;  Laterality: N/A;  BMI    Body Mass Index: 28.07 kg/m      Reproductive/Obstetrics negative OB ROS                            Anesthesia Physical  Anesthesia Plan  ASA: 2  Anesthesia Plan: General   Post-op Pain Management: Minimal or no pain anticipated   Induction: Intravenous  PONV Risk Score and Plan: Propofol infusion and TIVA  Airway Management Planned: Natural Airway  Additional Equipment:   Intra-op Plan:   Post-operative Plan:   Informed Consent: I have reviewed the patients History and Physical, chart, labs and discussed the procedure including the risks, benefits and alternatives for the proposed anesthesia with the patient or authorized representative  who has indicated his/her understanding and acceptance.     Dental Advisory Given  Plan Discussed with: Anesthesiologist, CRNA and Surgeon  Anesthesia Plan Comments:         Anesthesia Quick Evaluation

## 2022-01-27 NOTE — H&P (Signed)
Tammy Darby, MD [address]  [place]  [place], [place] [address]  Main: [phone]  Fax: [phone] Pager: [phone]  Primary Care Physician:  Virginia Crews, MD Primary Gastroenterologist:  Dr. Cephas Washington  Pre-Procedure History & Physical: HPI:  Tammy Washington is a 31 y.o. female is here for an colonoscopy.   Past Medical History:  Diagnosis Date   Allergy    COVID-19 09/2020   H/O colonoscopy    Migraine headache     Past Surgical History:  Procedure Laterality Date   COLONOSCOPY WITH PROPOFOL N/A 12/12/2017   Procedure: COLONOSCOPY WITH PROPOFOL;  Surgeon: Lin Landsman, MD;  Location: [facility];  Service: Endoscopy;  Laterality: N/A;   ESOPHAGOGASTRODUODENOSCOPY (EGD) WITH PROPOFOL N/A 01/17/2022   Procedure: ESOPHAGOGASTRODUODENOSCOPY (EGD) WITH BIOPSIES;  Surgeon: Lin Landsman, MD;  Location: [facility];  Service: Endoscopy;  Laterality: N/A;   FLEXIBLE SIGMOIDOSCOPY N/A 01/17/2022   Procedure: FLEXIBLE SIGMOIDOSCOPY WITH BIOPSY;  Surgeon: Lin Landsman, MD;  Location: [facility];  Service: Endoscopy;  Laterality: N/A;   POLYPECTOMY N/A 01/17/2022   Procedure: POLYPECTOMY;  Surgeon: Lin Landsman, MD;  Location: [facility];  Service: Endoscopy;  Laterality: N/A;    Prior to Admission medications   Medication Sig Start Date End Date Taking? Authorizing Provider  nortriptyline (PAMELOR) 10 MG capsule Take 1 capsule (10 mg total) by mouth at bedtime. 09/13/21 01/27/22 Yes [name], DO  Probiotic Product (PROBIOTIC PO) Take by mouth daily.   Yes [provider]  promethazine (PHENERGAN) 25 MG tablet Take 25 mg by mouth every 6 (six) hours as needed for nausea or vomiting.   Yes [provider]    Allergies as of 01/24/2022   (No Known Allergies)    Family History  Problem Relation Age of Onset   Healthy Mother    Rheum arthritis Father    Colon polyps  Sister 52       serrated adenoma   Healthy Sister    Diabetes type I Paternal Aunt    Dementia Maternal Grandmother    Colon cancer Neg Hx    Breast cancer Neg Hx    Ovarian cancer Neg Hx    Cervical cancer Neg Hx     Social History   Socioeconomic History   Marital status: Single    Spouse name: Not on file   Number of children: 0   Years of education: 16   Highest education level: Bachelor's degree (e.g., BA, AB, BS)  Occupational History   Occupation: Optometrist at [employer]: [employer]  Tobacco Use   Smoking status: Former    Years: 3.00    Types: Cigarettes    Quit date: 04/24/2015    Years since quitting: 6.7   Smokeless tobacco: Never   Tobacco comments:    former social smoker  Vaping Use   Vaping Use: Never used  Substance and Sexual Activity   Alcohol use: Yes    Alcohol/week: 1.0 - 2.0 standard drink of alcohol    Types: 1 - 2 Cans of beer per week   Drug use: Never   Sexual activity: Not Currently    Partners: Male    Birth control/protection: Abstinence  Other Topics Concern   Not on file  Social History Narrative   Not on file   Social Determinants of Health   Financial Resource Strain: Not on file  Food Insecurity: Not on  file  Transportation Needs: Not on file  Physical Activity: Not on file  Stress: Not on file  Social Connections: Not on file  Intimate Partner Violence: Not on file    Review of Systems: See HPI, otherwise negative ROS  Physical Exam: BP 120/85   Pulse 73   Temp (!) 96.6 F (35.9 C) (Temporal)   Resp 16   Ht 6' (1.829 m)   Wt 93.4 kg   LMP 01/22/2022 (Exact Date)   SpO2 100%   BMI 27.94 kg/m  General:   Alert,  pleasant and cooperative in NAD Head:  Normocephalic and atraumatic. Neck:  Supple; no masses or thyromegaly. Lungs:  Clear throughout to auscultation.    Heart:  Regular rate and rhythm. Abdomen:  Soft, nontender and nondistended. Normal bowel sounds, without guarding, and without  rebound.   Neurologic:  Alert and  oriented x4;  grossly normal neurologically.  Impression/Plan: Tammy Washington is here for an colonoscopy to be performed for colon adenoma  Risks, benefits, limitations, and alternatives regarding  colonoscopy have been reviewed with the patient.  Questions have been answered.  All parties agreeable.   Sherri Sear, MD  01/27/2022, 7:45 AM

## 2022-01-27 NOTE — Op Note (Signed)
[facility] Gastroenterology Patient Name: Loza Prell Procedure Date: 01/27/2022 7:03 AM MRN: [id number] Account #: [id number] Date of Birth: 10-27-1990 Admit Type: Outpatient Age: 31 Room: [facility] ENDO ROOM 2 Gender: Female Note Status: Finalized Instrument Name: Colonscope [id number] Procedure:             Colonoscopy Indications:           This is the patient's first colonoscopy, High risk                         colon cancer surveillance: Personal history of adenoma                         less than 10 mm in size Providers:             Levander Katzenstein Raeanne Gathers MD, MD Referring MD:          Dionne Bucy. Bacigalupo (Referring MD) Medicines:             General Anesthesia Complications:         No immediate complications. Estimated blood loss: None. Procedure:             Pre-Anesthesia Assessment:                        - Prior to the procedure, a History and Physical was                         performed, and patient medications and allergies were                         reviewed. The patient is competent. The risks and                         benefits of the procedure and the sedation options and                         risks were discussed with the patient. All questions                         were answered and informed consent was obtained.                         Patient identification and proposed procedure were                         verified by the physician, the nurse, the                         anesthesiologist, the anesthetist and the technician                         in the pre-procedure area in the procedure room in the                         endoscopy suite. Mental Status Examination: normal.                         Airway Examination: normal oropharyngeal airway and  neck mobility. Respiratory Examination: clear to                         auscultation. CV Examination: irregularly irregular                         rate and rhythm.  Prophylactic Antibiotics: The patient                         does not require prophylactic antibiotics. Prior                         Anticoagulants: The patient has taken no previous                         anticoagulant or antiplatelet agents. ASA Grade                         Assessment: II - A patient with mild systemic disease.                         After reviewing the risks and benefits, the patient                         was deemed in satisfactory condition to undergo the                         procedure. The anesthesia plan was to use general                         anesthesia. Immediately prior to administration of                         medications, the patient was re-assessed for adequacy                         to receive sedatives. The heart rate, respiratory                         rate, oxygen saturations, blood pressure, adequacy of                         pulmonary ventilation, and response to care were                         monitored throughout the procedure. The physical                         status of the patient was re-assessed after the                         procedure.                        After obtaining informed consent, the colonoscope was                         passed under direct vision. Throughout the procedure,  the patient's blood pressure, pulse, and oxygen                         saturations were monitored continuously. The                         Colonoscope was introduced through the anus and                         advanced to the the cecum, identified by appendiceal                         orifice and ileocecal valve. The colonoscopy was                         performed without difficulty. The patient tolerated                         the procedure well. The quality of the bowel                         preparation was evaluated using the BBPS [facility] Bowel                         Preparation Scale) with scores of:  Right Colon = 3,                         Transverse Colon = 3 and Left Colon = 3 (entire mucosa                         seen well with no residual staining, small fragments                         of stool or opaque liquid). The total BBPS score                         equals 9. Findings:      The perianal and digital rectal examinations were normal. Pertinent       negatives include normal sphincter tone and no palpable rectal lesions.      Three sessile polyps were found in the recto-sigmoid colon. The polyps       were diminutive in size. These polyps were removed with a cold biopsy       forceps. Resection and retrieval were complete.      The retroflexed view of the distal rectum and anal verge was normal and       showed no anal or rectal abnormalities.      A few small-mouthed diverticula were found in the sigmoid colon. Impression:            - Three diminutive polyps at the recto-sigmoid colon,                         removed with a cold biopsy forceps. Resected and                         retrieved.                        -  The distal rectum and anal verge are normal on                         retroflexion view.                        - Diverticulosis in the sigmoid colon. Recommendation:        - Discharge patient to home (with escort).                        - Resume previous diet today.                        - Continue present medications.                        - Await pathology results.                        - Repeat colonoscopy in 7-10 years for surveillance                         based on pathology results. Procedure Code(s):     --- Professional ---                        [phone], Colonoscopy, flexible; with biopsy, single or                         multiple Diagnosis Code(s):     --- Professional ---                        Z86.010, Personal history of colonic polyps                        K63.5, Polyp of colon                        K57.30, Diverticulosis of large  intestine without                         perforation or abscess without bleeding CPT copyright 2019 American Medical Association. All rights reserved. The codes documented in this report are preliminary and upon coder review may  be revised to meet current compliance requirements. Dr. Ulyess Mort Lin Landsman MD, MD 01/27/2022 8:17:50 AM This report has been signed electronically. Number of Addenda: 0 Note Initiated On: 01/27/2022 7:03 AM Scope Withdrawal Time: 0 hours 9 minutes 38 seconds  Total Procedure Duration: 0 hours 14 minutes 14 seconds  Estimated Blood Loss:  Estimated blood loss: none.      [facility]

## 2022-01-27 NOTE — Transfer of Care (Signed)
Immediate Anesthesia Transfer of Care Note  Patient: Tammy Washington  Procedure(s) Performed: COLONOSCOPY WITH PROPOFOL  Patient Location: PACU  Anesthesia Type:MAC  Level of Consciousness: awake, alert  and oriented  Airway & Oxygen Therapy: Patient Spontanous Breathing  Post-op Assessment: Report given to RN and Post -op Vital signs reviewed and stable  Post vital signs: stable  Last Vitals:  Vitals Value Taken Time  BP 96/71 01/27/22 0819  Temp 35.9 C 01/27/22 0819  Pulse 77 01/27/22 0822  Resp 22 01/27/22 0822  SpO2 100 % 01/27/22 0822  Vitals shown include unvalidated device data.  Last Pain:  Vitals:   01/27/22 0819  TempSrc: Oral  PainSc: 0-No pain         Complications: No notable events documented.

## 2022-01-27 NOTE — Anesthesia Postprocedure Evaluation (Signed)
Anesthesia Post Note  Patient: Tammy Washington  Procedure(s) Performed: COLONOSCOPY WITH PROPOFOL  Patient location during evaluation: PACU Anesthesia Type: General Level of consciousness: awake and alert Pain management: pain level controlled Vital Signs Assessment: post-procedure vital signs reviewed and stable Respiratory status: spontaneous breathing, nonlabored ventilation and respiratory function stable Cardiovascular status: blood pressure returned to baseline and stable Postop Assessment: no apparent nausea or vomiting Anesthetic complications: no Comments: PAC's noted on EKG. Pt reports history of lightheadedness. Advised to speak with PCP.   No notable events documented.   Last Vitals:  Vitals:   01/27/22 0840 01/27/22 0850  BP: 109/75 113/88  Pulse: 67 73  Resp: 14 14  Temp:    SpO2: 100% 100%    Last Pain:  Vitals:   01/27/22 0830  TempSrc:   PainSc: 0-No pain                 [place] [facility]

## 2022-01-30 ENCOUNTER — Encounter: Payer: Self-pay | Admitting: Gastroenterology

## 2022-01-30 ENCOUNTER — Ambulatory Visit: Payer: Self-pay | Admitting: *Deleted

## 2022-01-30 ENCOUNTER — Telehealth: Payer: Self-pay

## 2022-01-30 LAB — SURGICAL PATHOLOGY

## 2022-01-30 NOTE — Telephone Encounter (Signed)
Noted  

## 2022-01-30 NOTE — Progress Notes (Signed)
Thank you. I'll talk to her about next steps tomorrow.

## 2022-01-30 NOTE — Telephone Encounter (Signed)
  Chief Complaint: anxiety after being told she was having some kind of abnormal beats on the heart monitor when she had her colonoscopy done last Friday.   They did a EKG but didn't tell me anything except I was having an abnormal rhythm. Symptoms: Fluttering in chest, pressure in chest, shortness of breath and bad anxiety worse than my usual and lasting longer worrying about the EKG. Frequency: Since Friday but last night and today the symptoms are worse and not going away.   Pertinent Negatives: Patient denies Heart problems Disposition: '[]'$ ED /'[]'$ Urgent Care (no appt availability in office) / '[x]'$ Appointment(In office/virtual)/ '[]'$  [facility]/ '[]'$ Home Care/ '[]'$ Refused Recommended Disposition /'[]'$ [facility]/ '[]'$  Follow-up with PCP Additional Notes: Made her a follow up appt. For 01/31/2022 with Dr. Brita Romp.   She was agreeable to going on  to the ED now at [facility].

## 2022-01-30 NOTE — Telephone Encounter (Signed)
Called and left a message for call back  

## 2022-01-30 NOTE — Telephone Encounter (Signed)
-----   Message from Lin Landsman, MD sent at 01/30/2022  4:49 PM EDT ----- Tammy Washington  Please inform patient to start taking Tums 1 to 2 pills daily and her PCP will recheck her calcium tomorrow  RV

## 2022-01-30 NOTE — Telephone Encounter (Signed)
Reason for Disposition  [1] Panic attack symptoms (diagnosed in the past) AND [2] not better with usual treatment, reassurance, or Care Advice  Answer Assessment - Initial Assessment Questions 1. CONCERN: "Did anything happen that prompted you to call today?"      I had a colonoscopy done on Friday.    They told me my heart was skipping a beat.   My chest feels pressure and fluttering.   I'm having anxiety since they did an EKG and they have not told me anything.    I'm feeling anxiety,, pressure in my chest and I feel panicky.    I feel flutters. In my chest.    I feel like I can't take a deep breath.   I'm having anxiety.   I think it's from the EKG.   It usually goes away but this time it's worse and lasting longer.   I took my anxiety medicine and Buspar.   It helps a little but I'm still having fluttering.   2. ANXIETY SYMPTOMS: "Can you describe how you (your loved one; patient) have been feeling?" (e.g., tense, restless, panicky, anxious, keyed up, overwhelmed, sense of impending doom).      It's from the EKG.    3. ONSET: "How long have you been feeling this way?" (e.g., hours, days, weeks)     Friday       I found some stuff during a test I had before the colonoscopy.    4. SEVERITY: "How would you rate the level of anxiety?" (e.g., 0 - 10; or mild, moderate, severe).     Yes with anxiety.    I have these feelings when I have anxiety but this time it's not going away.    5. FUNCTIONAL IMPAIRMENT: "How have these feelings affected your ability to do daily activities?" "Have you had more difficulty than usual doing your normal daily activities?" (e.g., getting better, same, worse; self-care, school, work, interactions)     Yes with my anxiety 6. HISTORY: "Have you felt this way before?" "Have you ever been diagnosed with an anxiety problem in the past?" (e.g., generalized anxiety disorder, panic attacks, PTSD). If Yes, ask: "How was this problem treated?" (e.g., medicines, counseling, etc.)      Yes but not for this long. 7. RISK OF HARM - SUICIDAL IDEATION: "Do you ever have thoughts of hurting or killing yourself?" If Yes, ask:  "Do you have these feelings now?" "Do you have a plan on how you would do this?"     No thoughts of suicide  8. TREATMENT:  "What has been done so far to treat this anxiety?" (e.g., medicines, relaxation strategies). "What has helped?"     Taking my antianxiety medications. 9. TREATMENT - THERAPIST: "Do you have a counselor or therapist? Name?"     Not asked 10. POTENTIAL TRIGGERS: "Do you drink caffeinated beverages (e.g., coffee, colas, teas), and how much daily?" "Do you drink alcohol or use any drugs?" "Have you started any new medicines recently?"       Having the EKG done and no one telling me what is wrong. 11. PATIENT SUPPORT: "Who is with you now?" "Who do you live with?" "Do you have family or friends who you can talk to?"        I do have someone who can go with me to the ED 12. OTHER SYMPTOMS: "Do you have any other symptoms?" (e.g., feeling depressed, trouble concentrating, trouble sleeping, trouble breathing, palpitations or fast heartbeat, chest  pain, sweating, nausea, or diarrhea)       Anxiety worse than my usual.   I also have "health anxiety" so all this does not help. 13. PREGNANCY: "Is there any chance you are pregnant?" "When was your last menstrual period?"       Not asked  Protocols used: Anxiety and Panic Attack-A-AH

## 2022-01-31 ENCOUNTER — Ambulatory Visit: Payer: BC Managed Care – PPO | Attending: Family Medicine

## 2022-01-31 ENCOUNTER — Encounter: Payer: Self-pay | Admitting: Family Medicine

## 2022-01-31 ENCOUNTER — Ambulatory Visit: Payer: BC Managed Care – PPO | Admitting: Family Medicine

## 2022-01-31 DIAGNOSIS — I491 Atrial premature depolarization: Secondary | ICD-10-CM | POA: Diagnosis not present

## 2022-01-31 DIAGNOSIS — R002 Palpitations: Secondary | ICD-10-CM

## 2022-01-31 DIAGNOSIS — K582 Mixed irritable bowel syndrome: Secondary | ICD-10-CM

## 2022-01-31 DIAGNOSIS — F419 Anxiety disorder, unspecified: Secondary | ICD-10-CM

## 2022-01-31 MED ORDER — PAROXETINE HCL 20 MG PO TABS: 20.0000 mg | ORAL_TABLET | Freq: Every day | ORAL | 2 refills | Status: DC

## 2022-01-31 NOTE — Telephone Encounter (Signed)
Patient called back and verbalized understanding of results  

## 2022-01-31 NOTE — Assessment & Plan Note (Signed)
Tums 2 pills daily Recheck in 1 week

## 2022-01-31 NOTE — Assessment & Plan Note (Signed)
Chronic and uncontrolled No improvement in the past with Zoloft or Cymbalta Minimal improvement with nortriptyline Trial of Paxil 20 mg daily Discussed it can take 6 to 8 weeks to reach full efficacy Continue BuSpar at current dose Follow-up in 6 weeks and repeat PHQ-9 GAD-7

## 2022-01-31 NOTE — Progress Notes (Signed)
I,Sulibeya S Dimas,acting as a scribe for Lavon Paganini, MD.,have documented all relevant documentation on the behalf of Lavon Paganini, MD,as directed by  Lavon Paganini, MD while in the presence of Lavon Paganini, MD.     Established patient visit   Patient: Tammy Washington   DOB: 1990-12-29   31 y.o. Female  MRN: [id number] Visit Date: 01/31/2022  Today's healthcare provider: Lavon Paganini, MD   Chief Complaint  Patient presents with   Atrial Flutter   Subjective    HPI   Patient here today C/O "flutters" in the last few days. She reports symptoms are better today. She reports she does have anxiety. Patient reports taking Buspirone as needed. Patient reports she had an EKG done by GI on 01/27/22. She was advised to fu with PCP. Patient requesting to have lab done to fu on calcium and Anion gap.  Medications: Outpatient Medications Prior to Visit  Medication Sig   busPIRone (BUSPAR) 7.5 MG tablet Take 7.5 mg by mouth as needed.   Probiotic Product (PROBIOTIC PO) Take by mouth daily.   promethazine (PHENERGAN) 25 MG tablet Take 25 mg by mouth every 6 (six) hours as needed for nausea or vomiting.   [DISCONTINUED] nortriptyline (PAMELOR) 10 MG capsule Take 1 capsule (10 mg total) by mouth at bedtime.   No facility-administered medications prior to visit.    Review of Systems  Constitutional:  Positive for fatigue. Negative for appetite change.  Respiratory:  Positive for chest tightness and shortness of breath.   Cardiovascular:  Negative for chest pain and leg swelling.  Gastrointestinal:  Positive for nausea. Negative for abdominal pain.  Psychiatric/Behavioral:  Positive for sleep disturbance. The patient is nervous/anxious.     Last CBC Lab Results  Component Value Date   WBC 6.1 11/14/2021   HGB 11.3 11/14/2021   HCT 34.8 11/14/2021   MCV 83 11/14/2021   MCH 26.8 11/14/2021   RDW 15.0 11/14/2021   PLT 347 70/35/0093   Last metabolic  panel Lab Results  Component Value Date   GLUCOSE 93 01/27/2022   NA 137 01/27/2022   K 3.5 01/27/2022   CL 111 01/27/2022   CO2 23 01/27/2022   BUN 8 01/27/2022   CREATININE 0.65 01/27/2022   GFRNONAA >60 01/27/2022   CALCIUM 8.1 (L) 01/27/2022   PHOS 3.5 01/27/2022   PROT 7.4 11/14/2021   ALBUMIN 4.4 11/14/2021   LABGLOB 3.0 11/14/2021   AGRATIO 1.5 11/14/2021   BILITOT 0.2 11/14/2021   ALKPHOS 57 11/14/2021   AST 13 11/14/2021   ALT 10 11/14/2021   ANIONGAP 3 (L) 01/27/2022       Objective    BP 114/79 (BP Location: Left Arm, Patient Position: Sitting, Cuff Size: Large)   Pulse 78   Temp 98.2 F (36.8 C) (Oral)   Resp 16   Wt 210 lb 6.4 oz (95.4 kg)   LMP 01/22/2022 (Exact Date)   BMI 28.54 kg/m  BP Readings from Last 3 Encounters:  01/31/22 114/79  01/27/22 113/88  01/17/22 114/83   Wt Readings from Last 3 Encounters:  01/31/22 210 lb 6.4 oz (95.4 kg)  01/27/22 206 lb (93.4 kg)  01/17/22 206 lb 6.4 oz (93.6 kg)      Physical Exam Constitutional:      General: She is not in acute distress.    Appearance: Normal appearance.  Cardiovascular:     Rate and Rhythm: Normal rate. Rhythm irregular.     Pulses: Normal pulses.  Heart sounds: No murmur heard. Pulmonary:     Effort: Pulmonary effort is normal. No respiratory distress.     Breath sounds: No wheezing.  Skin:    General: Skin is warm and dry.     Findings: No rash.  Neurological:     Mental Status: She is alert and oriented to person, place, and time. Mental status is at baseline.  Psychiatric:        Mood and Affect: Mood normal.        Behavior: Behavior normal.     EKG reviewed - PACs, otherwise NSR  No results found for any visits on 01/31/22.  Assessment & Plan     Problem List Items Addressed This Visit       Cardiovascular and Mediastinum   Premature atrial contractions    Noted on EKG that was taken after her colonoscopy yesterday Did discuss with patient that things like  stress, dehydration, caffeine intake, dehydration can contribute to this She was feeling dehydrated after 2 colonoscopies in the last week or so Given that I do still hear her premature beats on exam today and she is still having palpitations, we will check for additional causes with TSH, CBC, CMP next week after she takes calcium supplement for her hypocalcemia as below Zio patch x14 days Consider cardiology referral pending results Discussed return precautions      Relevant Orders   Comprehensive metabolic panel   TSH   CBC   LONG TERM MONITOR (3-14 DAYS)     Digestive   Irritable bowel syndrome with both constipation and diarrhea    Reassured patient that it is okay to take MiraLAX daily if she needs to to manage her constipation and avoid anal fissure Discussed that the nortriptyline she is taking can be constipating and we will be switching it as below        Other   Anxiety    Chronic and uncontrolled No improvement in the past with Zoloft or Cymbalta Minimal improvement with nortriptyline Trial of Paxil 20 mg daily Discussed it can take 6 to 8 weeks to reach full efficacy Continue BuSpar at current dose Follow-up in 6 weeks and repeat PHQ-9 GAD-7      Relevant Medications   busPIRone (BUSPAR) 7.5 MG tablet   PARoxetine (PAXIL) 20 MG tablet   Palpitations    See plan above      Relevant Orders   Comprehensive metabolic panel   TSH   CBC   LONG TERM MONITOR (3-14 DAYS)   Hypocalcemia - Primary    Tums 2 pills daily Recheck in 1 week      Relevant Orders   Comprehensive metabolic panel     Return in about 6 weeks (around 03/14/2022) for MDD/GAD f/u, virtual ok.      I, Lavon Paganini, MD, have reviewed all documentation for this visit. The documentation on 01/31/22 for the exam, diagnosis, procedures, and orders are all accurate and complete.   Lyndzie Zentz, Dionne Bucy, MD, MPH [facility]

## 2022-01-31 NOTE — Assessment & Plan Note (Signed)
See plan above.

## 2022-01-31 NOTE — Assessment & Plan Note (Signed)
Noted on EKG that was taken after her colonoscopy yesterday Did discuss with patient that things like stress, dehydration, caffeine intake, dehydration can contribute to this She was feeling dehydrated after 2 colonoscopies in the last week or so Given that I do still hear her premature beats on exam today and she is still having palpitations, we will check for additional causes with TSH, CBC, CMP next week after she takes calcium supplement for her hypocalcemia as below Zio patch x14 days Consider cardiology referral pending results Discussed return precautions

## 2022-01-31 NOTE — Telephone Encounter (Signed)
Called and left a message for call back. Patient has appointment with PCP today

## 2022-01-31 NOTE — Assessment & Plan Note (Signed)
Reassured patient that it is okay to take MiraLAX daily if she needs to to manage her constipation and avoid anal fissure Discussed that the nortriptyline she is taking can be constipating and we will be switching it as below

## 2022-02-11 ENCOUNTER — Encounter: Payer: Self-pay | Admitting: Emergency Medicine

## 2022-02-11 ENCOUNTER — Emergency Department
Admission: EM | Admit: 2022-02-11 | Discharge: 2022-02-11 | Discharge status: Against Medical Advice | Payer: BC Managed Care – PPO | Attending: Emergency Medicine | Admitting: Emergency Medicine

## 2022-02-11 ENCOUNTER — Emergency Department: Payer: BC Managed Care – PPO

## 2022-02-11 DIAGNOSIS — R6883 Chills (without fever): Secondary | ICD-10-CM | POA: Insufficient documentation

## 2022-02-11 DIAGNOSIS — R002 Palpitations: Secondary | ICD-10-CM | POA: Insufficient documentation

## 2022-02-11 DIAGNOSIS — Z5321 Procedure and treatment not carried out due to patient leaving prior to being seen by health care provider: Secondary | ICD-10-CM | POA: Diagnosis not present

## 2022-02-11 LAB — BASIC METABOLIC PANEL
Anion gap: 7 (ref 5–15)
BUN: 15 mg/dL (ref 6–20)
CO2: 25 mmol/L (ref 22–32)
Calcium: 9 mg/dL (ref 8.9–10.3)
Chloride: 106 mmol/L (ref 98–111)
Creatinine, Ser: 0.72 mg/dL (ref 0.44–1.00)
GFR, Estimated: >60 mL/min (ref >60)
Glucose, Bld: 98 mg/dL (ref 70–99)
Potassium: 3.6 mmol/L (ref 3.5–5.1)
Sodium: 138 mmol/L (ref 135–145)

## 2022-02-11 LAB — CBC
HCT: 36.1 % (ref 36.0–46.0)
Hemoglobin: 11.4 g/dL — ABNORMAL LOW (ref 12.0–15.0)
MCH: 25.7 pg — ABNORMAL LOW (ref 26.0–34.0)
MCHC: 31.6 g/dL (ref 30.0–36.0)
MCV: 81.3 fL (ref 80.0–100.0)
Platelets: 362 10*3/uL (ref 150–400)
RBC: 4.44 MIL/uL (ref 3.87–5.11)
RDW: 14.6 % (ref 11.5–15.5)
WBC: 7 10*3/uL (ref 4.0–10.5)
nRBC: 0 % (ref 0.0–0.2)

## 2022-02-11 LAB — POC URINE PREG, ED: Preg Test, Ur: NEGATIVE

## 2022-02-11 LAB — TROPONIN I (HIGH SENSITIVITY): Troponin I (High Sensitivity): 3 ng/L (ref <18)

## 2022-02-11 NOTE — ED Notes (Signed)
Pts fiance approached the desk to inform this RN that the patient felt like she was going to faint. Pt placed in a recliner & VS obtained. Pt states she felt better once placed in the recliner.

## 2022-02-11 NOTE — ED Triage Notes (Signed)
Pt presents via POV with complaints of heart palpitations for the last several hours. Pt states she recently had a colonoscopy and was told she had an arhythmia and is currently wearing a heart monitor. Pt states feeling "cold chills/sensation" all over her body. Denies SOB.

## 2022-02-20 ENCOUNTER — Telehealth: Payer: BC Managed Care – PPO | Admitting: Family Medicine

## 2022-02-20 DIAGNOSIS — U071 COVID-19: Secondary | ICD-10-CM | POA: Diagnosis not present

## 2022-02-20 MED ORDER — NIRMATRELVIR/RITONAVIR (PAXLOVID)TABLET: 3.0000 | ORAL_TABLET | Freq: Two times a day (BID) | ORAL | 0 refills | Status: AC

## 2022-02-20 NOTE — Progress Notes (Signed)
MyChart Video Visit    Virtual Visit via Video Note   This visit type was conducted due to national recommendations for restrictions regarding the COVID-19 Pandemic (e.g. social distancing) in an effort to limit this patient's exposure and mitigate transmission in our community. This patient is at least at moderate risk for complications without adequate follow up. This format is felt to be most appropriate for this patient at this time. Physical exam was limited by quality of the video and audio technology used for the visit.    Patient location: home Provider location: [place] involved in the visit: patient, provider  I discussed the limitations of evaluation and management by telemedicine and the availability of in person appointments. The patient expressed understanding and agreed to proceed.  Patient: Tammy Washington   DOB: November 07, 1990   31 y.o. Female  MRN: [id number] Visit Date: 02/20/2022  Today's healthcare provider: Lavon Paganini, MD   No chief complaint on file.  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   busPIRone (BUSPAR) 7.5 MG tablet Take 7.5 mg by mouth as needed.   Probiotic Product (PROBIOTIC PO) Take by mouth daily.   promethazine (PHENERGAN) 25 MG tablet Take 25 mg by mouth every 6 (six) hours as needed for nausea or vomiting.   [DISCONTINUED] PARoxetine (PAXIL) 20 MG tablet Take 1 tablet (20 mg total) by mouth daily.   No facility-administered medications prior to visit.    Review of Systems     Objective    LMP 01/22/2022 (Exact Date)      Physical Exam Constitutional:      General: She is not in acute distress.    Appearance:  Normal appearance.  HENT:     Head: Normocephalic.  Pulmonary:     Effort: Pulmonary effort is normal. No respiratory distress.  Neurological:     Mental Status: She is alert and oriented to person, place, and time. Mental status is at baseline.        Assessment & Plan     1. COVID-19 - symptoms c/w viral URI - home COVID test positive - no evidence of strep pharyngitis, CAP, AOM, bacterial sinusitis, or other bacterial infection - concern for possible COVID19 infection - will send for outpatient testing - discussed need to quarantine 5 days from start of symptoms and until fever-free for at least 24 hours - discussed symptomatic management, natural course, and return precautions    - paxlovid treatment - discussed possible side effects  Meds ordered this encounter  Medications   nirmatrelvir/ritonavir EUA (PAXLOVID) 20 x 150 MG & 10 x '100MG'$  TABS    Sig: Take 3 tablets by mouth 2 (two) times daily for 5 days. (Take nirmatrelvir 150 mg two tablets twice daily for 5 days and ritonavir 100 mg one tablet twice daily for 5 days) Patient GFR is >60    Dispense:  30 tablet    Refill:  0     No follow-ups on file.     I discussed the assessment and treatment plan with the patient. The patient was provided an opportunity to ask questions and all were answered. The patient agreed with the plan and demonstrated an understanding of  the instructions.   The patient was advised to call back or seek an in-person evaluation if the symptoms worsen or if the condition fails to improve as anticipated.  I, Lavon Paganini, MD, have reviewed all documentation for this visit. The documentation on 02/20/22 for the exam, diagnosis, procedures, and orders are all accurate and complete.   Gaylin Osoria, Dionne Bucy, MD, MPH [facility]

## 2022-03-15 NOTE — Progress Notes (Deleted)
I,Allayna Erlich S Kielee Care,acting as a Education administrator for Lavon Paganini, MD.,have documented all relevant documentation on the behalf of Lavon Paganini, MD,as directed by  Lavon Paganini, MD while in the presence of Lavon Paganini, MD.   MyChart Video Visit    Virtual Visit via Video Note   This format is felt to be most appropriate for this patient at this time. Physical exam was limited by quality of the video and audio technology used for the visit.   Patient location: *** Provider location: ***  I discussed the limitations of evaluation and management by telemedicine and the availability of in person appointments. The patient expressed understanding and agreed to proceed.  Patient: Tammy Washington   DOB: May 21, 1990   31 y.o. Female  MRN: [id number] Visit Date: 03/21/2022  Today's healthcare provider: Lavon Paganini, MD   No chief complaint on file.  Subjective    HPI  Anxiety, Follow-up  She was last seen for anxiety 1 months ago. Changes made at last visit include D/C nortriptyline. Start Paxil 20 mg daily. Continue Buspar 7.5 mg daily.    She reports {excellent/good/fair/poor:19665} compliance with treatment. She reports {good/fair/poor:18685} tolerance of treatment. She {is/is not:21021397} having side effects. {document side effects if present:1}  She feels her anxiety is {Desc; severity:60313} and {improved/worse/unchanged:3041574} since last visit.  Symptoms: {Yes/No:20286} chest pain {Yes/No:20286} difficulty concentrating  {Yes/No:20286} dizziness {Yes/No:20286} fatigue  {Yes/No:20286} feelings of losing control {Yes/No:20286} insomnia  {Yes/No:20286} irritable {Yes/No:20286} palpitations  {Yes/No:20286} panic attacks {Yes/No:20286} racing thoughts  {Yes/No:20286} shortness of breath {Yes/No:20286} sweating  {Yes/No:20286} tremors/shakes    GAD-7 Results    01/31/2022   10:13 AM 11/14/2021    1:48 PM 09/13/2021   12:06 PM  GAD-7 Generalized Anxiety  Disorder Screening Tool  1. Feeling Nervous, Anxious, or on Edge '3 2 3  '$ 2. Not Being Able to Stop or Control Worrying '3 2 3  '$ 3. Worrying Too Much About Different Things '1 2 3  '$ 4. Trouble Relaxing '2 2 3  '$ 5. Being So Restless it's Hard To Sit Still '1 1 3  '$ 6. Becoming Easily Annoyed or Irritable '3 3 3  '$ 7. Feeling Afraid As If Something Awful Might Happen '1 2 2  '$ Total GAD-7 Score '14 14 20  '$ Difficulty At Work, Home, or Getting  Along With Others? Very difficult Very difficult Very difficult    PHQ-9 Scores    01/31/2022   10:07 AM 11/14/2021    1:45 PM 09/13/2021   11:35 AM  PHQ9 SCORE ONLY  PHQ-9 Total Score '14 15 7    '$ ---------------------------------------------------------------------------------------------------    Medications: Outpatient Medications Prior to Visit  Medication Sig   busPIRone (BUSPAR) 7.5 MG tablet Take 7.5 mg by mouth as needed.   nortriptyline (PAMELOR) 10 MG capsule Take 10 mg by mouth at bedtime.   Probiotic Product (PROBIOTIC PO) Take by mouth daily.   promethazine (PHENERGAN) 25 MG tablet Take 25 mg by mouth every 6 (six) hours as needed for nausea or vomiting.   No facility-administered medications prior to visit.    Review of Systems  {Labs  Heme  Chem  Endocrine  Serology  Results Review (optional):23779}   Objective    There were no vitals taken for this visit.  {Show previous vital signs (optional):23777}   Physical Exam     Assessment & Plan     ***  No follow-ups on file.     I discussed the assessment and treatment plan with the patient. The patient was provided  an opportunity to ask questions and all were answered. The patient agreed with the plan and demonstrated an understanding of the instructions.   The patient was advised to call back or seek an in-person evaluation if the symptoms worsen or if the condition fails to improve as anticipated.  I provided *** minutes of non-face-to-face time during this  encounter.  {provider attestation***:1}  Lavon Paganini, MD [facility] [phone] (phone) [phone] (fax)  [place]

## 2022-03-21 ENCOUNTER — Telehealth: Payer: BC Managed Care – PPO | Admitting: Family Medicine

## 2022-03-23 ENCOUNTER — Ambulatory Visit: Payer: BC Managed Care – PPO | Admitting: Gastroenterology

## 2022-03-27 ENCOUNTER — Ambulatory Visit: Payer: BC Managed Care – PPO | Admitting: Gastroenterology

## 2022-07-15 ENCOUNTER — Ambulatory Visit
Admission: EM | Admit: 2022-07-15 | Discharge: 2022-07-15 | Disposition: A | Discharge status: Home / Self Care | Payer: BC Managed Care – PPO

## 2022-07-15 ENCOUNTER — Emergency Department: Payer: BC Managed Care – PPO

## 2022-07-15 ENCOUNTER — Other Ambulatory Visit: Payer: Self-pay

## 2022-07-15 ENCOUNTER — Emergency Department
Admission: EM | Admit: 2022-07-15 | Discharge: 2022-07-15 | Disposition: A | Discharge status: Home / Self Care | Payer: BC Managed Care – PPO | Attending: Emergency Medicine | Admitting: Emergency Medicine

## 2022-07-15 DIAGNOSIS — F419 Anxiety disorder, unspecified: Secondary | ICD-10-CM | POA: Diagnosis not present

## 2022-07-15 DIAGNOSIS — R079 Chest pain, unspecified: Secondary | ICD-10-CM | POA: Diagnosis present

## 2022-07-15 DIAGNOSIS — R0602 Shortness of breath: Secondary | ICD-10-CM | POA: Diagnosis not present

## 2022-07-15 DIAGNOSIS — R0789 Other chest pain: Secondary | ICD-10-CM | POA: Diagnosis not present

## 2022-07-15 LAB — CBC
HCT: 37.8 % (ref 36.0–46.0)
Hemoglobin: 12.4 g/dL (ref 12.0–15.0)
MCH: 27.5 pg (ref 26.0–34.0)
MCHC: 32.8 g/dL (ref 30.0–36.0)
MCV: 83.8 fL (ref 80.0–100.0)
Platelets: 365 10*3/uL (ref 150–400)
RBC: 4.51 MIL/uL (ref 3.87–5.11)
RDW: 15.3 % (ref 11.5–15.5)
WBC: 5.8 10*3/uL (ref 4.0–10.5)
nRBC: 0 % (ref 0.0–0.2)

## 2022-07-15 LAB — BASIC METABOLIC PANEL
Anion gap: 10 (ref 5–15)
BUN: 9 mg/dL (ref 6–20)
CO2: 23 mmol/L (ref 22–32)
Calcium: 8.9 mg/dL (ref 8.9–10.3)
Chloride: 103 mmol/L (ref 98–111)
Creatinine, Ser: 0.71 mg/dL (ref 0.44–1.00)
GFR, Estimated: >60 mL/min (ref >60)
Glucose, Bld: 120 mg/dL — ABNORMAL HIGH (ref 70–99)
Potassium: 3.1 mmol/L — ABNORMAL LOW (ref 3.5–5.1)
Sodium: 136 mmol/L (ref 135–145)

## 2022-07-15 LAB — TROPONIN I (HIGH SENSITIVITY): Troponin I (High Sensitivity): <2 ng/L (ref <18)

## 2022-07-15 MED ORDER — IOHEXOL 350 MG/ML SOLN
75.0000 mL | Freq: Once | INTRAVENOUS | Status: AC | PRN
  Administered 2022-07-15: 75 mL via INTRAVENOUS

## 2022-07-15 NOTE — ED Notes (Signed)
POC Preg negative

## 2022-07-15 NOTE — ED Notes (Signed)
Pt given verbal reassurance, questions answered

## 2022-07-15 NOTE — ED Provider Notes (Signed)
[facility] Provider Note    Event Date/Time   First MD Initiated Contact with Patient 07/15/22 1529     (approximate)   History   Chest Pain   HPI  Tammy Washington is a 32 y.o. female with a history of anxiety who presents with complaints of chest pain shortness of breath.  Patient reports that she has been anxious about a work trip that she recently had, in fact when she was there in [place] she is went to an ED because of chest discomfort at that time but she reports it started prior to the trip they diagnosed her with anxiety however she reports symptoms are continuing on.  She also has a history of acid reflux      Physical Exam   Triage Vital Signs: ED Triage Vitals  Enc Vitals Group     BP 07/15/22 1512 135/82     Pulse Rate 07/15/22 1512 89     Resp 07/15/22 1512 18     Temp 07/15/22 1512 98.7 F (37.1 C)     Temp Source 07/15/22 1512 Oral     SpO2 07/15/22 1512 100 %     Weight --      Height --      Head Circumference --      Peak Flow --      Pain Score 07/15/22 1513 3     Pain Loc --      Pain Edu? --      Excl. in [place]? --     Most recent vital signs: Vitals:   07/15/22 1512  BP: 135/82  Pulse: 89  Resp: 18  Temp: 98.7 F (37.1 C)  SpO2: 100%     General: Awake, no distress.  Anxious CV:  Good peripheral perfusion.  Regular rate and rhythm Resp:  Normal effort.  Clear to auscultation bilaterally Abd:  No distention.  Other:  No calf pain or swelling   ED Results / Procedures / Treatments   Labs (all labs ordered are listed, but only abnormal results are displayed) Labs Reviewed  BASIC METABOLIC PANEL - Abnormal; Notable for the following components:      Result Value   Potassium 3.1 (*)    Glucose, Bld 120 (*)    All other components within normal limits  CBC  POC URINE PREG, ED  TROPONIN I (HIGH SENSITIVITY)     EKG  ED ECG REPORT I, Lavonia Drafts, the attending physician, personally viewed and  interpreted this ECG.  Date: 07/15/2022  Rhythm: normal sinus rhythm QRS Axis: normal Intervals: normal ST/T Wave abnormalities: normal Narrative Interpretation: no evidence of acute ischemia    RADIOLOGY Chest x-ray viewed interpreted by me, no acute abnormality    PROCEDURES:  Critical Care performed:   Procedures   MEDICATIONS ORDERED IN ED: Medications  iohexol (OMNIPAQUE) 350 MG/ML injection 75 mL (75 mLs Intravenous Contrast Given 07/15/22 1708)     IMPRESSION / MDM / ASSESSMENT AND PLAN / ED COURSE  I reviewed the triage vital signs and the nursing notes. Patient's presentation is most consistent with acute presentation with potential threat to life or bodily function.   Patient presents with chest pain shortness of breath as detailed above, she reports it is worse with deep inspiration as well.  Differential includes PE  ACS, anxiety, musculoskeletal pain, GERD EKG is overall reassuring, high sensitive troponin is normal.  Lab work is overall unremarkable.  Chest x-ray is negative for pneumonia or  pneumothorax  CT angiography negative for PE  I suspect GERD is playing a role in her symptoms as well as anxiety.  Recommend taking Prilosec x 14 days, will refer to cardiology as well.  Strict return precautions, no indication for admission at this time given reassuring workup, patient agrees with this plan.        FINAL CLINICAL IMPRESSION(S) / ED DIAGNOSES   Final diagnoses:  Atypical chest pain     Rx / DC Orders   ED Discharge Orders          Ordered    Ambulatory referral to Cardiology       Comments: If you have not heard from the Cardiology office within the next 72 hours please call [phone].   07/15/22 1803             Note:  This document was prepared using Dragon voice recognition software and may include unintentional dictation errors.   Lavonia Drafts, MD 07/15/22 [place]

## 2022-07-15 NOTE — ED Triage Notes (Addendum)
Pt c/o CP that radiates into jaw, palpitations, lightheadedness, and anxiety. Pt has been seen recently for sinus infection and anxiety, has been taking valerian root as recommended by provider. Pt AOX4, skin warm, dry, and pink, nasal congestion noted. Pt appears shaky and nervous.

## 2022-08-18 IMAGING — CR DG ABDOMEN 1V
1 series · 2 of 2 positions shown · non-contrast
Comparison: 07/12/2009

CLINICAL DATA: Constipation

EXAM:
ABDOMEN - 1 VIEW

[Series 1: dg abd 1 view · 0.14mm/px · 2 of 2 slices shown]
[im 1/2]
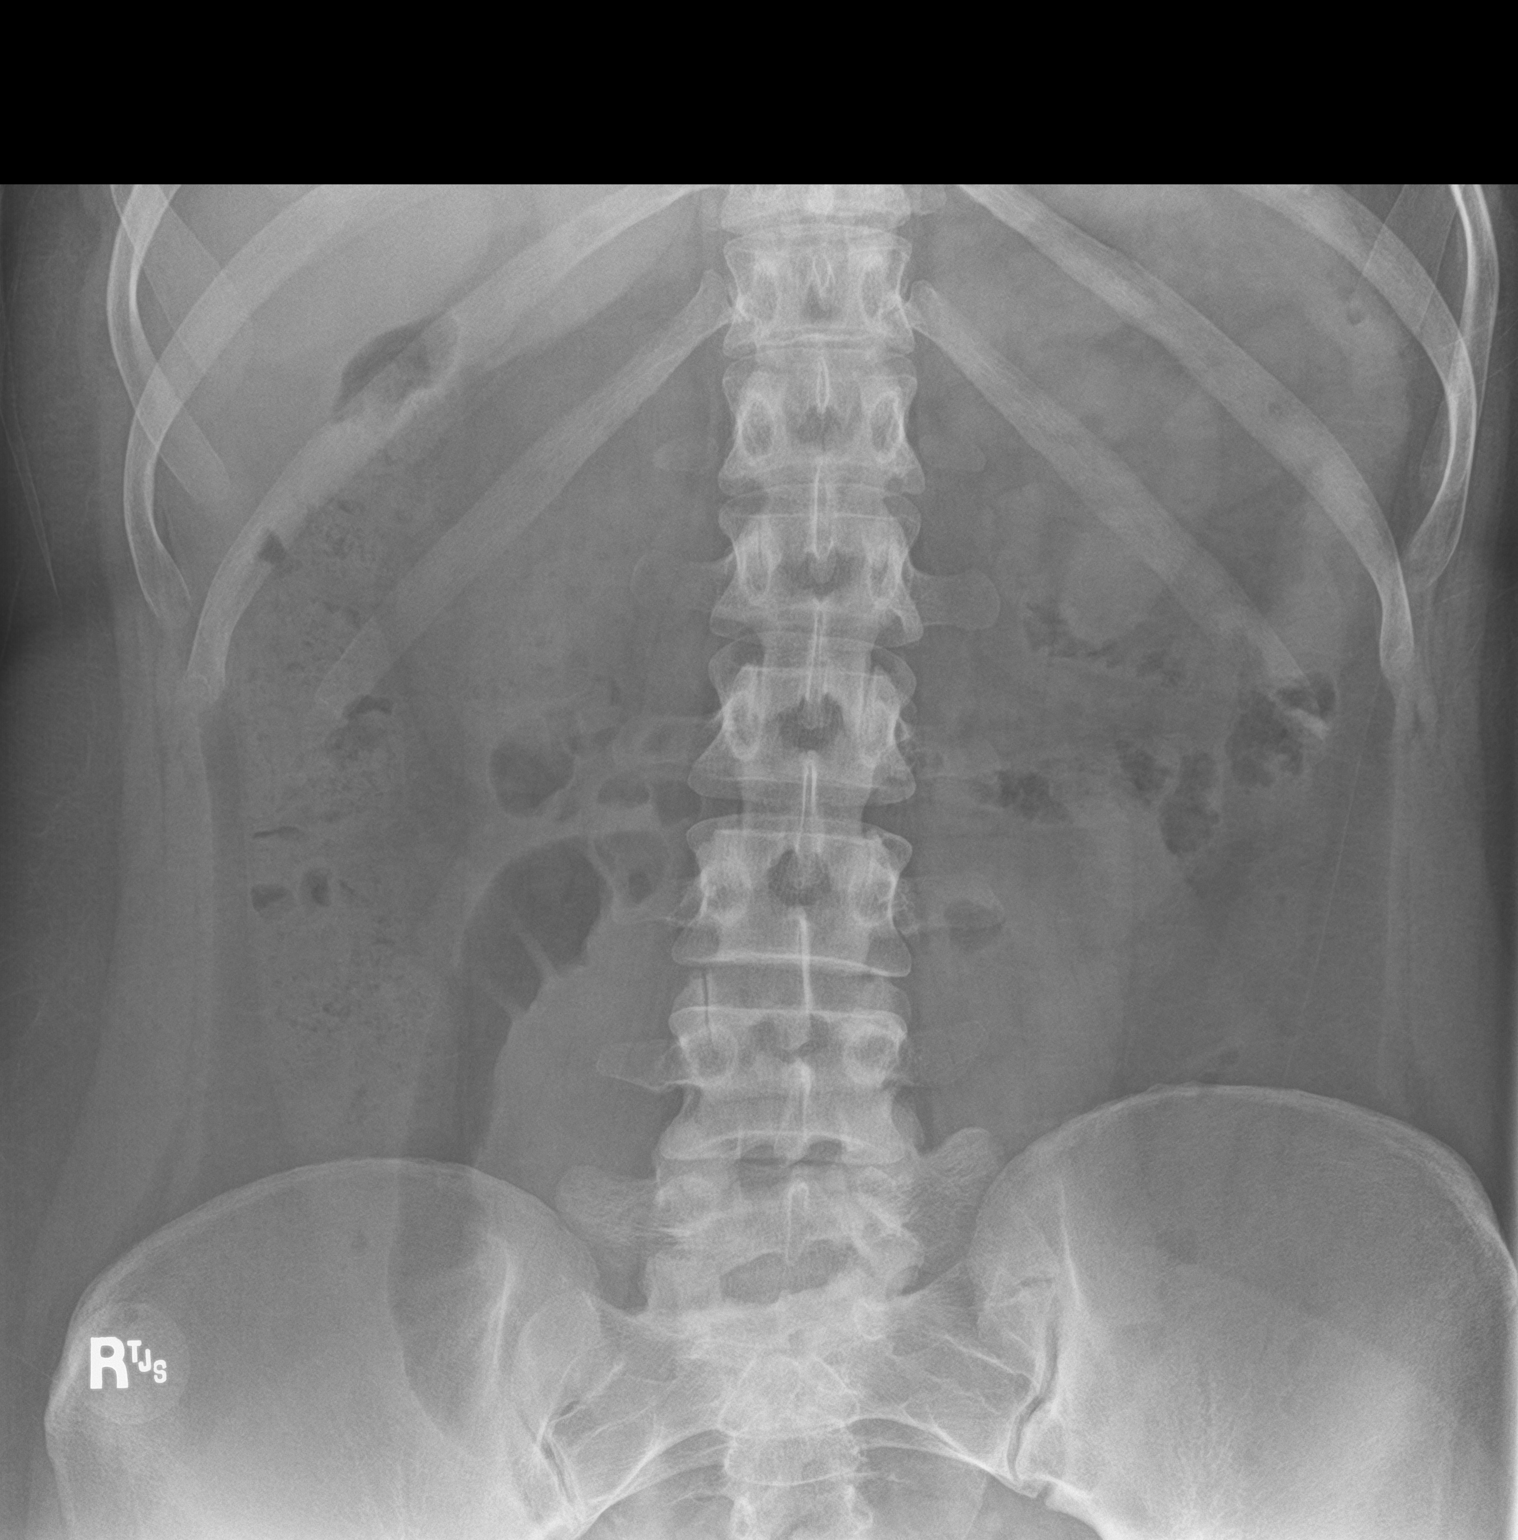
[im 2/2]
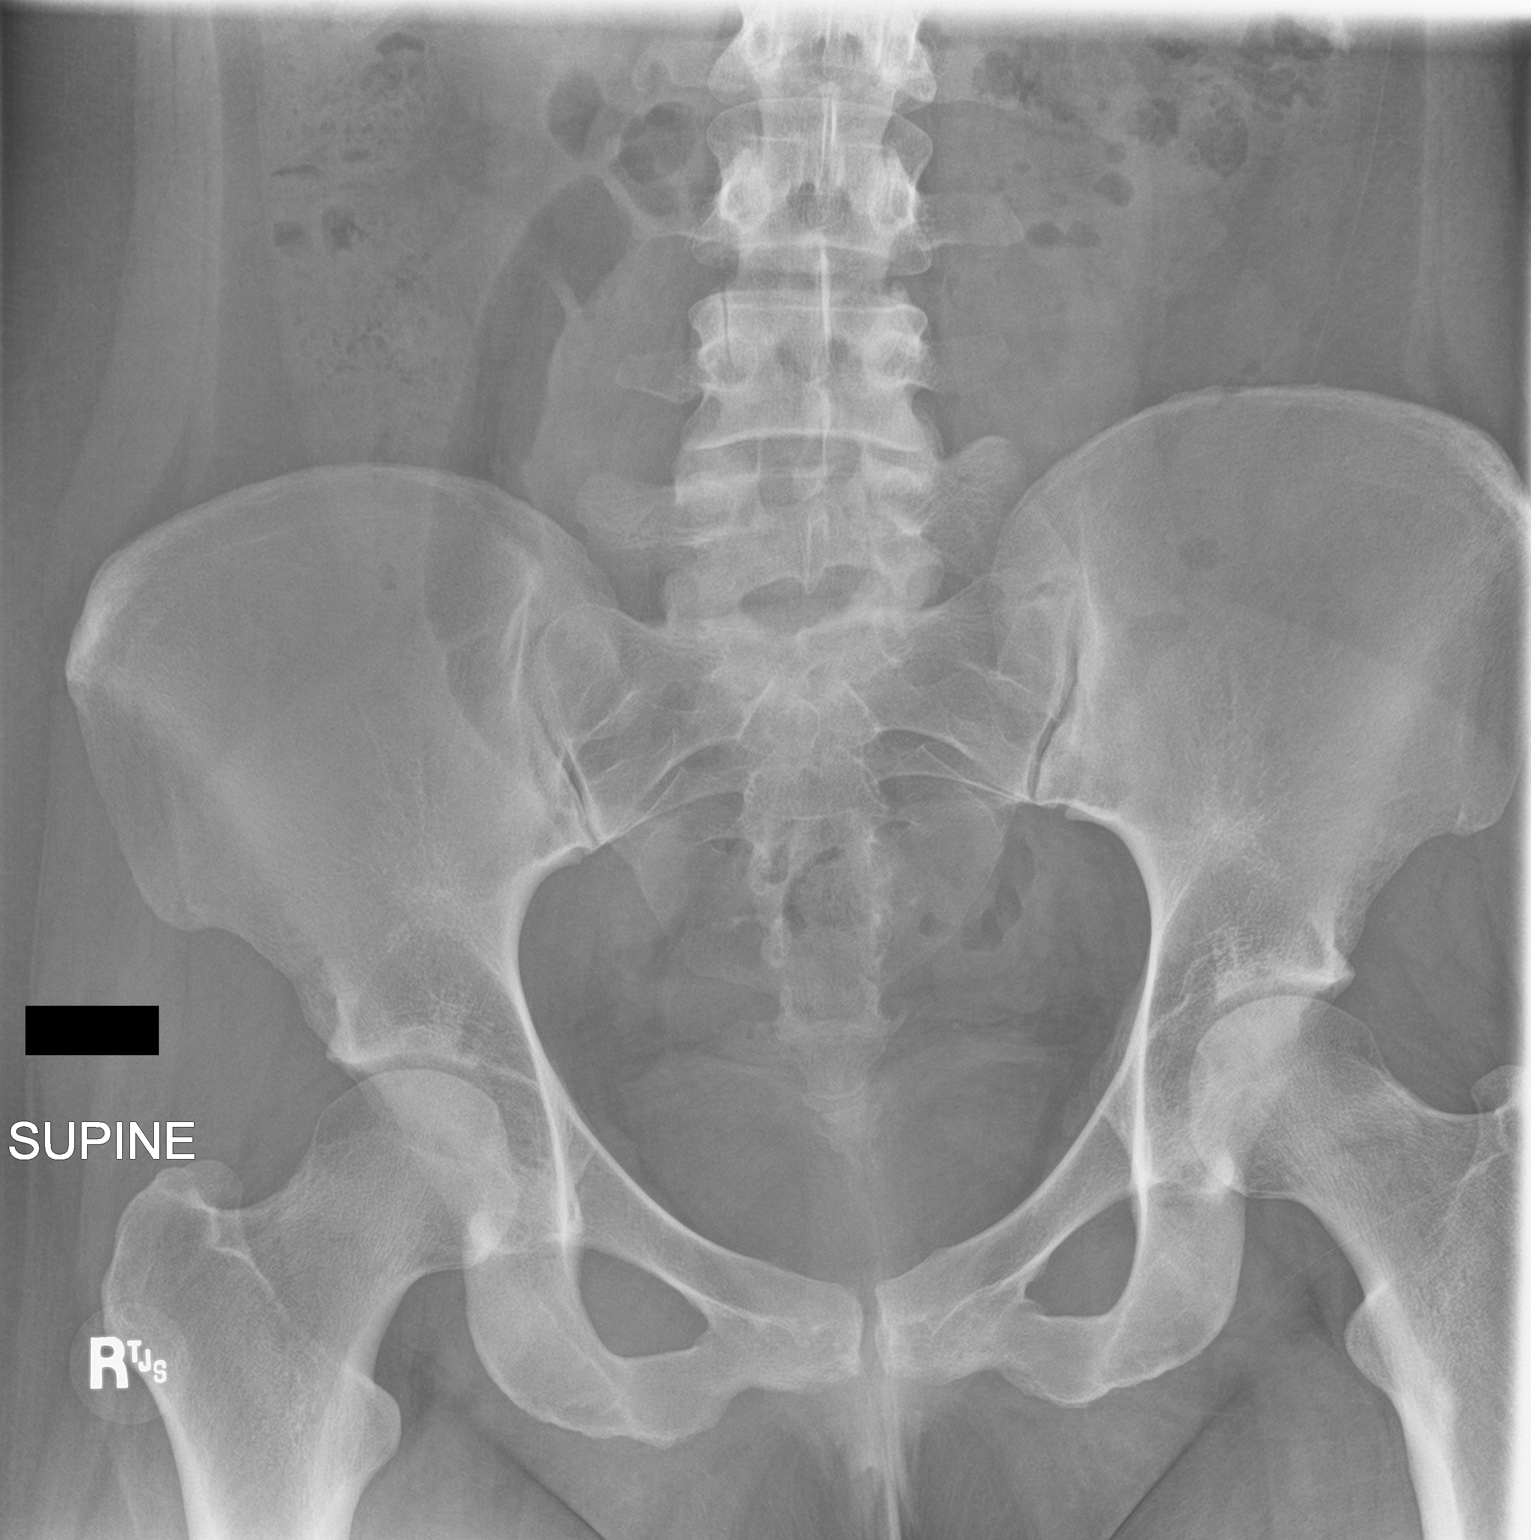

[2 of 2 positions shown; findings below may reference images not displayed]

FINDINGS: The bowel gas pattern is normal. No radio-opaque calculi or other
significant radiographic abnormality are seen. Mild stool in the
colon.
IMPRESSION: Negative.

## 2022-09-14 ENCOUNTER — Encounter: Payer: Self-pay | Admitting: Family Medicine

## 2022-09-14 ENCOUNTER — Ambulatory Visit: Payer: BC Managed Care – PPO | Admitting: Family Medicine

## 2022-09-14 VITALS — BP 134/83 | HR 88 | Temp 98.2°F | Resp 16 | Wt 235.0 lb

## 2022-09-14 DIAGNOSIS — F419 Anxiety disorder, unspecified: Secondary | ICD-10-CM

## 2022-09-14 DIAGNOSIS — F451 Undifferentiated somatoform disorder: Secondary | ICD-10-CM | POA: Diagnosis not present

## 2022-09-14 MED ORDER — CLONAZEPAM 0.5 MG PO TABS: 0.5000 mg | ORAL_TABLET | Freq: Two times a day (BID) | ORAL | 1 refills | Status: AC | PRN

## 2022-09-14 MED ORDER — SERTRALINE HCL 50 MG PO TABS: 50.0000 mg | ORAL_TABLET | Freq: Every day | ORAL | 3 refills | Status: DC

## 2022-09-14 NOTE — Progress Notes (Signed)
I,Sulibeya S Dimas,acting as a scribe for Shirlee Latch, MD.,have documented all relevant documentation on the behalf of Shirlee Latch, MD,as directed by  Shirlee Latch, MD while in the presence of Shirlee Latch, MD.     Established patient visit   Patient: Tammy Washington   DOB: [birth year]   31 y.o. Female  MRN: [id number] Visit Date: 09/14/2022  Today's healthcare provider: Shirlee Latch, MD   Chief Complaint  Patient presents with   Anxiety   Subjective    HPI  Anxiety, Follow-up  Patient reports her anxiety is worse. She has been to ED twice here in town and once in [place]. She reports she is also taking OTC medication for heartburn, as advised by ED.  She was last seen for anxiety 6 months ago. Changes made at last visit include trial of paxil 20 mg. Buspirone 7.5 mg as needed. And Pamelor daily.   She reports excellent compliance with treatment. Paxil 20 mg poor tolerance. Had more anxiety, stomach ache and nausea. She took 2 weeks only.  She reports excellent tolerance of treatment. She is not having side effects.   She feels her anxiety is severe and Worse since last visit.  Ashwaganda, magnesium helping  Symptoms: Yes chest pain Yes difficulty concentrating  Yes dizziness Yes fatigue  Yes feelings of losing control Yes insomnia  Yes irritable Yes palpitations  Yes panic attacks Yes racing thoughts  No shortness of breath Yes sweating  Yes tremors/shakes    GAD-7 Results    09/14/2022    2:47 PM 01/31/2022   10:13 AM 11/14/2021    1:48 PM  GAD-7 Generalized Anxiety Disorder Screening Tool  1. Feeling Nervous, Anxious, or on Edge 3 3 2   2. Not Being Able to Stop or Control Worrying 3 3 2   3. Worrying Too Much About Different Things 3 1 2   4. Trouble Relaxing 3 2 2   5. Being So Restless it's Hard To Sit Still 1 1 1   6. Becoming Easily Annoyed or Irritable 3 3 3   7. Feeling Afraid As If Something Awful Might Happen 3 1 2   Total GAD-7  Score 19 14 14   Difficulty At Work, Home, or Getting  Along With Others? Extremely difficult Very difficult Very difficult    PHQ-9 Scores    09/14/2022    2:48 PM 01/31/2022   10:07 AM 11/14/2021    1:45 PM  PHQ9 SCORE ONLY  PHQ-9 Total Score 15 14 15     ---------------------------------------------------------------------------------------------------   Medications: Outpatient Medications Prior to Visit  Medication Sig   Probiotic Product (PROBIOTIC PO) Take by mouth daily.   promethazine (PHENERGAN) 25 MG tablet Take 25 mg by mouth every 6 (six) hours as needed for nausea or vomiting.   [DISCONTINUED] busPIRone (BUSPAR) 7.5 MG tablet Take 7.5 mg by mouth as needed.   [DISCONTINUED] nortriptyline (PAMELOR) 10 MG capsule Take 10 mg by mouth at bedtime.   No facility-administered medications prior to visit.    Review of Systems  Cardiovascular:  Positive for chest pain and palpitations.  Gastrointestinal:  Positive for abdominal pain and nausea.  Psychiatric/Behavioral:  Positive for agitation, decreased concentration, dysphoric mood and sleep disturbance. The patient is nervous/anxious.        Objective    BP 134/83 (BP Location: Left Arm, Patient Position: Sitting, Cuff Size: Large)   Pulse 88   Temp 98.2 F (36.8 C) (Temporal)   Resp 16   Wt 235 lb (106.6 kg)   LMP 09/14/2022 (  Exact Date)   SpO2 97%   BMI 31.87 kg/m    Physical Exam Vitals reviewed.  Constitutional:      General: She is not in acute distress.    Appearance: Normal appearance. She is well-developed. She is not diaphoretic.  HENT:     Head: Normocephalic and atraumatic.  Eyes:     General: No scleral icterus.    Conjunctiva/sclera: Conjunctivae normal.  Neck:     Thyroid: No thyromegaly.  Cardiovascular:     Rate and Rhythm: Normal rate and regular rhythm.     Heart sounds: Normal heart sounds. No murmur heard. Pulmonary:     Effort: Pulmonary effort is normal. No respiratory distress.      Breath sounds: Normal breath sounds. No wheezing, rhonchi or rales.  Musculoskeletal:     Cervical back: Neck supple.     Right lower leg: No edema.     Left lower leg: No edema.  Lymphadenopathy:     Cervical: No cervical adenopathy.  Skin:    General: Skin is warm and dry.     Findings: No rash.  Neurological:     Mental Status: She is alert and oriented to person, place, and time. Mental status is at baseline.  Psychiatric:        Mood and Affect: Affect normal. Mood is anxious.        Behavior: Behavior normal.      No results found for any visits on 09/14/22.  Assessment & Plan     Problem List Items Addressed This Visit       Other   Anxiety - Primary    Chronic and uncontrolled No improvement in the past with several medications, but patient is wondering if she gave them enough of a try She had side effects with wellbutrin D/c buspar and nortriptyline Will Start zoloft 50 mg daily Discussed potential side effects, incl GI upset, sexual dysfunction, and SI Discussed that it can take 6-8 weeks to reach full efficacy Contracted for safety - no SI/HI Discussed synergistic effects of medications and therapy  Small supply of low dose klonopin for as needed use while ramping up SSRI - not planning for long term treatment with klonopin - discussed risks Follow-up in 6 weeks and repeat PHQ-9 GAD-7      Relevant Medications   sertraline (ZOLOFT) 50 MG tablet   Somatic symptom disorder    Discussed with patient that many of her symptoms - GI upset, chest pain, etc are likely somatic symptoms She has had multiple images and procedures to rule out organic causes Treat anxiety as above Discussed grounding techniques and box breathing        Return in about 6 weeks (around 10/26/2022) for MDD/GAD f/u, virtual ok.      I, Shirlee Latch, MD, have reviewed all documentation for this visit. The documentation on 09/15/22 for the exam, diagnosis, procedures, and orders  are all accurate and complete.   Beauden Tremont, Marzella Schlein, MD, MPH [facility]

## 2022-09-15 NOTE — Assessment & Plan Note (Signed)
Chronic and uncontrolled No improvement in the past with several medications, but patient is wondering if she gave them enough of a try She had side effects with wellbutrin D/c buspar and nortriptyline Will Start zoloft 50 mg daily Discussed potential side effects, incl GI upset, sexual dysfunction, and SI Discussed that it can take 6-8 weeks to reach full efficacy Contracted for safety - no SI/HI Discussed synergistic effects of medications and therapy  Small supply of low dose klonopin for as needed use while ramping up SSRI - not planning for long term treatment with klonopin - discussed risks Follow-up in 6 weeks and repeat PHQ-9 GAD-7

## 2022-09-15 NOTE — Assessment & Plan Note (Signed)
Discussed with patient that many of her symptoms - GI upset, chest pain, etc are likely somatic symptoms She has had multiple images and procedures to rule out organic causes Treat anxiety as above Discussed grounding techniques and box breathing

## 2022-10-05 ENCOUNTER — Ambulatory Visit: Payer: Self-pay

## 2022-10-05 ENCOUNTER — Ambulatory Visit
Admission: EM | Admit: 2022-10-05 | Discharge: 2022-10-05 | Disposition: A | Discharge status: Home / Self Care | Payer: BC Managed Care – PPO

## 2022-10-05 DIAGNOSIS — H9203 Otalgia, bilateral: Secondary | ICD-10-CM | POA: Diagnosis not present

## 2022-10-05 DIAGNOSIS — R42 Dizziness and giddiness: Secondary | ICD-10-CM

## 2022-10-05 HISTORY — DX: Anxiety disorder, unspecified: F41.9

## 2022-10-05 NOTE — Telephone Encounter (Signed)
Dizziness, question tmj, ear infection, or side effect of new medication      Chief Complaint: Dizziness,ear pain, jaw pain. Pt. States she is on her way to [facility]. Symptoms: Above Frequency: Last week Pertinent Negatives: Patient denies  Disposition: [] ED /[] Urgent Care (no appt availability in office) / [] Appointment(In office/virtual)/ []  [facility]/ [] Home Care/ [x] Refused Recommended Disposition /[] [facility]/ []  Follow-up with PCP Additional Notes: States "I decided to go ahead and get seen today at [facility] because I know Dr. B is not available."   Reason for Disposition  [1] MODERATE dizziness (e.g., interferes with normal activities) AND [2] has NOT been evaluated by doctor (or NP/PA) for this  (Exception: Dizziness caused by heat exposure, sudden standing, or poor fluid intake.)  Answer Assessment - Initial Assessment Questions 1. DESCRIPTION: "Describe your dizziness."     Dizzy 2. LIGHTHEADED: "Do you feel lightheaded?" (e.g., somewhat faint, woozy, weak upon standing)     Woozy 3. VERTIGO: "Do you feel like either you or the room is spinning or tilting?" (i.e. vertigo)     No 4. SEVERITY: "How bad is it?"  "Do you feel like you are going to faint?" "Can you stand and walk?"   - MILD: Feels slightly dizzy, but walking normally.   - MODERATE: Feels unsteady when walking, but not falling; interferes with normal activities (e.g., school, work).   - SEVERE: Unable to walk without falling, or requires assistance to walk without falling; feels like passing out now.      Mild-moderate 5. ONSET:  "When did the dizziness begin?"     Last week 6. AGGRAVATING FACTORS: "Does anything make it worse?" (e.g., standing, change in head position)     Walking 7. HEART RATE: "Can you tell me your heart rate?" "How many beats in 15 seconds?"  (Note: not all patients can do this)       No 8. CAUSE: "What do you think is causing the dizziness?"     Unsure 9. RECURRENT SYMPTOM:  "Have you had dizziness before?" If Yes, ask: "When was the last time?" "What happened that time?"     No 10. OTHER SYMPTOMS: "Do you have any other symptoms?" (e.g., fever, chest pain, vomiting, diarrhea, bleeding)       Ear pain 11. PREGNANCY: "Is there any chance you are pregnant?" "When was your last menstrual period?"       No  Protocols used: Dizziness - Lightheadedness-A-AH

## 2022-10-05 NOTE — ED Triage Notes (Signed)
Pt reports bilateral ear pain, dizziness, HA x 10 days.  Worse yesterday and today.  Describes dizziness as a room spinning and off-balance but not feeling of passing out.  Reports nasal pressure.  Using Flonase x 3 days and hot compresses. Has also tried Tylenol.   Did start new meds three weeks ago.

## 2022-10-05 NOTE — ED Provider Notes (Signed)
Renaldo Fiddler    CSN: [id number] Arrival date & time: 10/05/22  1408      History   Chief Complaint Chief Complaint  Patient presents with   Dizziness   Otalgia    HPI Tammy Washington is a 32 y.o. female.    Dizziness Otalgia   Patient presents to urgent care with complaint of bilateral ear pain, dizziness, headache x 10 days.  She endorses symptoms worse yesterday and today she describes dizziness as room spinning and difficulty maintaining balance.  Denies syncope or near syncope.  She reports nasal pressure.  Has been using Flonase x 3 days along with hot compresses trying Tylenol.  She endorses starting a new medication 3 weeks ago.  Review of the patient's chart shows initiation of sertraline along with a short-term prescription of treatment with BuSpar patient endorses history of "severe anxiety".  She denies significant nasal congestion but endorses postnasal drip.  No nasal discharge but she states she is able to out green-colored sputum.  She denies cough.  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   Diarrhea 06/17/2019   Allergic rhinitis 11/19/2017   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   Migraines 11/19/2017   Family history of polyps in the colon 11/19/2017   Anxiety 11/19/2017    Past Surgical History:  Procedure Laterality Date   COLONOSCOPY WITH PROPOFOL N/A 12/12/2017   Procedure: COLONOSCOPY  WITH PROPOFOL;  Surgeon: Toney Reil, MD;  Location: [facility];  Service: Endoscopy;  Laterality: N/A;   COLONOSCOPY WITH PROPOFOL N/A 01/27/2022   Procedure: COLONOSCOPY WITH PROPOFOL;  Surgeon: Toney Reil, MD;  Location: [facility];  Service: Gastroenterology;  Laterality: N/A;   ESOPHAGOGASTRODUODENOSCOPY (EGD) WITH PROPOFOL N/A 01/17/2022   Procedure: ESOPHAGOGASTRODUODENOSCOPY (EGD) WITH BIOPSIES;  Surgeon: Toney Reil, MD;  Location: [facility];  Service: Endoscopy;  Laterality: N/A;   FLEXIBLE SIGMOIDOSCOPY N/A 01/17/2022   Procedure: FLEXIBLE SIGMOIDOSCOPY WITH BIOPSY;  Surgeon: Toney Reil, MD;  Location: [facility];  Service: Endoscopy;  Laterality: N/A;   POLYPECTOMY N/A 01/17/2022   Procedure: POLYPECTOMY;  Surgeon: Toney Reil, MD;  Location: [facility];  Service: Endoscopy;  Laterality: N/A;    OB History   No obstetric history on file.      Home Medications    Prior to Admission medications   Medication Sig Start Date End Date Taking? Authorizing Provider  clonazePAM (KLONOPIN) 0.5 MG tablet Take 1 tablet (0.5 mg total) by mouth 2 (two) times daily as needed for anxiety. 09/14/22   Erasmo Downer, MD  Probiotic Product (PROBIOTIC PO) Take by mouth daily.    [provider]  promethazine (PHENERGAN) 25 MG tablet Take 25 mg by mouth every 6 (six) hours as needed for nausea or vomiting.    [provider]  sertraline (ZOLOFT) 50 MG tablet Take 1 tablet (50 mg total) by  mouth daily. 09/14/22   Bacigalupo, Marzella Schlein, MD    Family History Family History  Problem Relation Age of Onset   Healthy Mother    Rheum arthritis Father    Colon polyps Sister 18       serrated adenoma   Mitral valve prolapse Sister    Healthy Sister    Dementia Maternal Grandmother    Diabetes type I Paternal Aunt    Colon cancer Neg Hx    Breast cancer Neg Hx    Ovarian cancer Neg Hx    Cervical  cancer Neg Hx     Social History Social History   Tobacco Use   Smoking status: Former    Years: 3    Types: Cigarettes    Quit date: 04/24/2015    Years since quitting: 7.4   Smokeless tobacco: Never   Tobacco comments:    former social smoker  Vaping Use   Vaping Use: Never used  Substance Use Topics   Alcohol use: Yes    Alcohol/week: 1.0 - 2.0 standard drink of alcohol    Types: 1 - 2 Cans of beer per week    Comment: rarely   Drug use: Never     Allergies   Patient has no known allergies.   Review of Systems Review of Systems  HENT:  Positive for ear pain.   Neurological:  Positive for dizziness.    Physical Exam Triage Vital Signs ED Triage Vitals [10/05/22 1420]  Enc Vitals Group     BP      Pulse      Resp      Temp      Temp src      SpO2      Weight      Height      Head Circumference      Peak Flow      Pain Score 4     Pain Loc      Pain Edu?      Excl. in GC?    No data found.  Updated Vital Signs LMP 09/14/2022 (Exact Date)   Visual Acuity Right Eye Distance:   Left Eye Distance:   Bilateral Distance:    Right Eye Near:   Left Eye Near:    Bilateral Near:     Physical Exam Vitals reviewed.  Constitutional:      Appearance: Normal appearance. She is not ill-appearing.  HENT:     Right Ear: Tympanic membrane normal.     Left Ear: Tympanic membrane normal.  Eyes:     Extraocular Movements:     Right eye: Normal extraocular motion.     Left eye: Normal extraocular motion and no nystagmus.     Comments: Nystagmus not observed. EOM WNL  Skin:    General: Skin is warm and dry.  Neurological:     General: No focal deficit present.     Mental Status: She is alert and oriented to person, place, and time.  Psychiatric:        Mood and Affect: Mood normal.        Behavior: Behavior normal.      UC Treatments / Results  Labs (all labs ordered are listed, but only abnormal results are displayed) Labs Reviewed - No data to  display  EKG   Radiology No results found.  Procedures Procedures (including critical care time)  Medications Ordered in UC Medications - No data to display  Initial Impression / Assessment and  Plan / UC Course  I have reviewed the triage vital signs and the nursing notes.  Pertinent labs & imaging results that were available during my care of the patient were reviewed by me and considered in my medical decision making (see chart for details).   Tammy Washington is a 32 y.o. female presenting with vertigo like symptoms and otalgia. Patient is afebrile without recent antipyretics, satting well on room air. Overall is well appearing, well hydrated, without respiratory distress.  WNL bilaterally.  Reviewed relevant chart history.  Suspect sinusitis as a cause of her symptoms though no clear underlying etiology.  Given duration, would suspect bacterial infection secondary to a viral however her symptoms do not seem to fit this etiology.  Would consider prednisone except for her comorbidity of severe".  She is currently using Flonase once a day.  Recommended increasing to twice a day and adding Sudafed.  Suggested evaluation by her optometrist/eye provider to rule out an optical etiology though I think this is unlikely.  Recommended her PCP for ENT evaluation.  Counseled patient on potential for adverse effects with medications prescribed/recommended today, ER and return-to-clinic precautions discussed, patient verbalized understanding and agreement with care plan.  Final Clinical Impressions(s) / UC Diagnoses   Final diagnoses:  None   Discharge Instructions   None    ED Prescriptions   None    PDMP not reviewed this encounter.   Charma Igo, [place] 10/05/22 1507

## 2022-10-05 NOTE — Discharge Instructions (Signed)
Follow up here or with your primary care provider if your symptoms are worsening or not improving.     

## 2022-10-07 ENCOUNTER — Other Ambulatory Visit: Payer: Self-pay | Admitting: Family Medicine

## 2022-10-16 ENCOUNTER — Encounter: Payer: Self-pay | Admitting: Family Medicine

## 2022-10-20 NOTE — Progress Notes (Unsigned)
    MyChart Video Visit    Virtual Visit via Video Note   This format is felt to be most appropriate for this patient at this time. Physical exam was limited by quality of the video and audio technology used for the visit.   Patient location: *** Provider location: ***  I discussed the limitations of evaluation and management by telemedicine and the availability of in person appointments. The patient expressed understanding and agreed to proceed.  Patient: Tammy Washington   DOB: 02/07/1991   32 y.o. Female  MRN: [id number] Visit Date: 10/23/2022  Today's healthcare provider: Shirlee Latch, MD   No chief complaint on file.  Subjective    HPI  ***   Medications: Outpatient Medications Prior to Visit  Medication Sig   clonazePAM (KLONOPIN) 0.5 MG tablet Take 1 tablet (0.5 mg total) by mouth 2 (two) times daily as needed for anxiety.   Probiotic Product (PROBIOTIC PO) Take by mouth daily.   promethazine (PHENERGAN) 25 MG tablet Take 25 mg by mouth every 6 (six) hours as needed for nausea or vomiting.   sertraline (ZOLOFT) 50 MG tablet TAKE 1 TABLET BY MOUTH EVERY DAY   No facility-administered medications prior to visit.    Review of Systems  {Labs  Heme  Chem  Endocrine  Serology  Results Review (optional):23779}   Objective    There were no vitals taken for this visit.  {Show previous vital signs (optional):23777}   Physical Exam     Assessment & Plan     ***  No follow-ups on file.     I discussed the assessment and treatment plan with the patient. The patient was provided an opportunity to ask questions and all were answered. The patient agreed with the plan and demonstrated an understanding of the instructions.   The patient was advised to call back or seek an in-person evaluation if the symptoms worsen or if the condition fails to improve as anticipated.  I provided *** minutes of non-face-to-face time during this encounter.  {provider  attestation***:1}  Shirlee Latch, MD [facility] [phone] (phone) [phone] (fax)  [facility]

## 2022-10-23 ENCOUNTER — Ambulatory Visit: Payer: Self-pay | Admitting: *Deleted

## 2022-10-23 ENCOUNTER — Encounter: Payer: Self-pay | Admitting: Family Medicine

## 2022-10-23 ENCOUNTER — Telehealth (INDEPENDENT_AMBULATORY_CARE_PROVIDER_SITE_OTHER): Payer: BC Managed Care – PPO | Admitting: Family Medicine

## 2022-10-23 ENCOUNTER — Telehealth: Payer: Self-pay | Admitting: Gastroenterology

## 2022-10-23 DIAGNOSIS — F451 Undifferentiated somatoform disorder: Secondary | ICD-10-CM | POA: Diagnosis not present

## 2022-10-23 DIAGNOSIS — F419 Anxiety disorder, unspecified: Secondary | ICD-10-CM

## 2022-10-23 MED ORDER — ESCITALOPRAM OXALATE 10 MG PO TABS: 10.0000 mg | ORAL_TABLET | Freq: Every day | ORAL | 3 refills | Status: DC

## 2022-10-23 NOTE — Telephone Encounter (Signed)
Pt left message having rectal bleeding needing a call back to see if it is normal or should she be concerned

## 2022-10-23 NOTE — Telephone Encounter (Signed)
Patient states she has been having rectal bleeding for 2 days and it is in between bowel movements when she takes Sitz bath. She states when she takes sitz bath it fills up the toilet. She states the blood is bright red blood and has some jelly like material in it. She is having rectal itching and rectal pain. She states it feels like her anal fissure is starting up again. Refilled the cream to [facility] and told her if it was not better next week to let us know next week. Faxed prescription to [facility]

## 2022-10-23 NOTE — Telephone Encounter (Signed)
Noted  

## 2022-10-23 NOTE — Assessment & Plan Note (Signed)
Initial improvement on Zoloft 50mg , but subsequent increase in anxiety symptoms, possibly related to dizziness and TMJ symptoms. Zoloft discontinued due to potential side effects. -Start Lexapro 10mg  daily as an alternative to Zoloft. -Continue Klonopin as needed.

## 2022-10-23 NOTE — Telephone Encounter (Signed)
Reason for Disposition  SEVERE dizziness (e.g., unable to stand, requires support to walk, feels like passing out now)  Answer Assessment - Initial Assessment Questions 1. APPEARANCE of BLOOD: "What color is it?" "Is it passed separately, on the surface of the stool, or mixed in with the stool?"      Separately- blood clots 2. AMOUNT: "How much blood was passed?"      Good amount- even with gas 3. FREQUENCY: "How many times has blood been passed with the stools?"      With am BM and every time 4. ONSET: "When was the blood first seen in the stools?" (Days or weeks)      Started yesterday 5. DIARRHEA: "Is there also some diarrhea?" If Yes, ask: "How many diarrhea stools in the past 24 hours?"      no 6. CONSTIPATION: "Do you have constipation?" If Yes, ask: "How bad is it?"     Yes- hx hemorrhoids  7. RECURRENT SYMPTOMS: "Have you had blood in your stools before?" If Yes, ask: "When was the last time?" and "What happened that time?"      Yes- at least 1 month 8. BLOOD THINNERS: "Do you take any blood thinners?" (e.g., Coumadin/warfarin, Pradaxa/dabigatran, aspirin)     no 9. OTHER SYMPTOMS: "Do you have any other symptoms?"  (e.g., abdomen pain, vomiting, dizziness, fever)     Shaky- patient thinks nerves  Protocols used: Rectal Bleeding-A-AH

## 2022-10-23 NOTE — Telephone Encounter (Signed)
  Chief Complaint: rectal bleeding- good amount since yesterday  Symptoms: red rectal bleeding with clots, dizzy- feels unsteady Frequency: every time BM and passes gas  Disposition: [x] ED /[] Urgent Care (no appt availability in office) / [] Appointment(In office/virtual)/ []  [facility]/ [] Home Care/ [] Refused Recommended Disposition /[] [facility]/ []  Follow-up with PCP Additional Notes: Due to patient anxiety and dizziness- advised ED. She does have someone to drive her.

## 2022-10-30 ENCOUNTER — Other Ambulatory Visit: Payer: Self-pay | Admitting: Family Medicine

## 2022-10-30 ENCOUNTER — Telehealth: Payer: BC Managed Care – PPO | Admitting: Family Medicine

## 2022-10-30 NOTE — Telephone Encounter (Signed)
Medication Refill - Medication: promethazine (PHENERGAN) 25 MG tablet [[id number]]   Has the patient contacted their pharmacy? No.   Preferred Pharmacy (with phone number or street name):  [facility] [phone] [address]ell Halim [address]  [address] [place] [address]  Phone: [phone] Fax: [phone]  Hours: Not open 24 hours      Has the patient been seen for an appointment in the last year OR does the patient have an upcoming appointment? Yes.    Agent: Please be advised that RX refills may take up to 3 business days. We ask that you follow-up with your pharmacy.

## 2022-10-30 NOTE — Telephone Encounter (Signed)
Requested medication (s) are due for refill today - unsure  Requested medication (s) are on the active medication list -yes  Future visit scheduled -no  Last refill: unknown  Notes to clinic: non delegated Rx, listed as historical medication   Requested Prescriptions  Pending Prescriptions Disp Refills   promethazine (PHENERGAN) 25 MG tablet 30 tablet     Sig: Take 1 tablet (25 mg total) by mouth every 6 (six) hours as needed for nausea or vomiting.     Not Delegated - Gastroenterology: Antiemetics Failed - 10/30/2022 10:33 AM      Failed - This refill cannot be delegated      Passed - Valid encounter within last 6 months    Recent Outpatient Visits           1 week ago Anxiety   [place] [facility] [place], Marzella Schlein, MD   1 month ago Anxiety   [facility], Marzella Schlein, MD   8 months ago COVID-19   [facility] [place], Marzella Schlein, MD   9 months ago Hypocalcemia   [facility] Erasmo Downer, MD   11 months ago Abdominal discomfort   [place] [facility] Caro Laroche, DO                 Requested Prescriptions  Pending Prescriptions Disp Refills   promethazine (PHENERGAN) 25 MG tablet 30 tablet     Sig: Take 1 tablet (25 mg total) by mouth every 6 (six) hours as needed for nausea or vomiting.     Not Delegated - Gastroenterology: Antiemetics Failed - 10/30/2022 10:33 AM      Failed - This refill cannot be delegated      Passed - Valid encounter within last 6 months    Recent Outpatient Visits           1 week ago Anxiety   [facility] [facility] [place], Marzella Schlein, MD   1 month ago Anxiety    [facility] [place], Marzella Schlein, MD   8 months ago COVID-19   [facility] [place], Marzella Schlein, MD   9 months ago Hypocalcemia   [facility] [place], Marzella Schlein, MD   11 months ago Abdominal discomfort   [facility] Caro Laroche, [place]

## 2022-11-03 MED ORDER — PROMETHAZINE HCL 25 MG PO TABS: 25.0000 mg | ORAL_TABLET | Freq: Four times a day (QID) | ORAL | 2 refills | Status: AC | PRN

## 2022-11-14 ENCOUNTER — Other Ambulatory Visit: Payer: Self-pay | Admitting: Family Medicine

## 2022-11-15 NOTE — Telephone Encounter (Signed)
Requested Prescriptions  Pending Prescriptions Disp Refills   escitalopram (LEXAPRO) 10 MG tablet [Pharmacy Med Name: ESCITALOPRAM 10 MG TABLET] 90 tablet 0    Sig: TAKE 1 TABLET BY MOUTH EVERY DAY     Psychiatry:  Antidepressants - SSRI Passed - 11/14/2022 11:34 AM      Passed - Valid encounter within last 6 months    Recent Outpatient Visits           3 weeks ago Anxiety   [place] [place], Marzella Schlein, MD   2 months ago Anxiety   [place] [facility] [place], Marzella Schlein, MD   8 months ago COVID-19   [facility] [place], Marzella Schlein, MD   9 months ago Hypocalcemia   [facility] [place], Marzella Schlein, MD   1 year ago Abdominal discomfort   [facility] Caro Laroche, [place]

## 2023-01-09 ENCOUNTER — Encounter: Payer: Self-pay | Admitting: Family Medicine

## 2023-01-09 ENCOUNTER — Telehealth (INDEPENDENT_AMBULATORY_CARE_PROVIDER_SITE_OTHER): Payer: BC Managed Care – PPO | Admitting: Family Medicine

## 2023-01-09 DIAGNOSIS — K219 Gastro-esophageal reflux disease without esophagitis: Secondary | ICD-10-CM | POA: Diagnosis not present

## 2023-01-09 DIAGNOSIS — F419 Anxiety disorder, unspecified: Secondary | ICD-10-CM | POA: Diagnosis not present

## 2023-01-09 DIAGNOSIS — R002 Palpitations: Secondary | ICD-10-CM | POA: Diagnosis not present

## 2023-01-09 DIAGNOSIS — F451 Undifferentiated somatoform disorder: Secondary | ICD-10-CM | POA: Diagnosis not present

## 2023-01-09 MED ORDER — FLUTICASONE PROPIONATE 50 MCG/ACT NA SUSP: 2.0000 | Freq: Every day | NASAL | 6 refills | Status: AC

## 2023-01-09 MED ORDER — ESCITALOPRAM OXALATE 20 MG PO TABS: 20.0000 mg | ORAL_TABLET | Freq: Every day | ORAL | 1 refills | Status: DC

## 2023-01-09 NOTE — Progress Notes (Signed)
MyChart Video Visit    Virtual Visit via Video Note   This format is felt to be most appropriate for this patient at this time. Physical exam was limited by quality of the video and audio technology used for the visit.   Patient location: Home Provider location: [facility]   I discussed the limitations of evaluation and management by telemedicine and the availability of in person appointments. The patient expressed understanding and agreed to proceed.  Patient: Tammy Washington   DOB: Mar 10, 1991   32 y.o. Female  MRN: [id number] Visit Date: 01/09/2023  Today's healthcare provider: Shirlee Latch, MD   Chief Complaint  Patient presents with   Anxiety   Subjective    Debro is here today as a follow up visit for anxiety. She previously tried Zoloft, but was experiencing side effects from it. Last visit she switched to 10 mg of Lexapro. She says the lexapro had been working wonderfully until this past week as she has been experiencing increased stressors in her life.  Additionally this past week she has been experiencing increased heart palpitations which she thinks may be related to her reflux. The palpitations are worse at night.    Medications: Outpatient Medications Prior to Visit  Medication Sig   clonazePAM (KLONOPIN) 0.5 MG tablet Take 1 tablet (0.5 mg total) by mouth 2 (two) times daily as needed for anxiety.   escitalopram (LEXAPRO) 10 MG tablet TAKE 1 TABLET BY MOUTH EVERY DAY   promethazine (PHENERGAN) 25 MG tablet Take 1 tablet (25 mg total) by mouth every 6 (six) hours as needed for nausea or vomiting.   Probiotic Product (PROBIOTIC PO) Take by mouth daily.   No facility-administered medications prior to visit.    Review of Systems  Cardiovascular:  Positive for palpitations.  Gastrointestinal: Negative.   Psychiatric/Behavioral:  Positive for sleep disturbance. The patient is nervous/anxious.     Objective    There were no vitals  taken for this visit.  Physical Exam Constitutional:      Appearance: Normal appearance.  Neurological:     General: No focal deficit present.     Mental Status: She is alert and oriented to person, place, and time.      Assessment & Plan     Problem List Items Addressed This Visit       Digestive   Gastroesophageal reflux disease    Patient reports flare up of GERD this past week  Recommended taking prn famotidine when symptoms are present        Other   Anxiety - Primary    Improvement on Lexapro 10 mg However, patient having increased anxiety with somatic symptoms this past week  Increase dose to Lexapro 20 mg  Fu in 4 - 6 weeks Continue klonopin prn        Relevant Medications   escitalopram (LEXAPRO) 20 MG tablet   Palpitations    Palpitations likely related to somatic symptoms due to anxiety  Continue Lexapro      Somatic symptom disorder    Somatic symptoms still present, but improved since initiation of Lexapro  Increased dose of Lexapro to 20 mg        Return in about 6 weeks (around 02/20/2023) for MDD/GAD f/u, virtual ok.     I discussed the assessment and treatment plan with the patient. The patient was provided an opportunity to ask questions and all were answered. The patient agreed with the plan and demonstrated an understanding  of the instructions.   The patient was advised to call back or seek an in-person evaluation if the symptoms worsen or if the condition fails to improve as anticipated.    Patient seen along with [employer] student Jodi Marble. I personally evaluated this patient along with the student, and verified all aspects of the history, physical exam, and medical decision making as documented by the student. I agree with the student's documentation and have made all necessary edits.  Bode Pieper, Marzella Schlein, MD, MPH [facility]

## 2023-01-09 NOTE — Assessment & Plan Note (Signed)
Somatic symptoms still present, but improved since initiation of Lexapro  Increased dose of Lexapro to 20 mg

## 2023-01-09 NOTE — Assessment & Plan Note (Signed)
Palpitations likely related to somatic symptoms due to anxiety  Continue Lexapro

## 2023-01-09 NOTE — Assessment & Plan Note (Addendum)
Improvement on Lexapro 10 mg However, patient having increased anxiety with somatic symptoms this past week  Increase dose to Lexapro 20 mg  Fu in 4 - 6 weeks Continue klonopin prn

## 2023-01-09 NOTE — Assessment & Plan Note (Signed)
Patient reports flare up of GERD this past week  Recommended taking prn famotidine when symptoms are present

## 2023-09-19 ENCOUNTER — Other Ambulatory Visit: Payer: Self-pay | Admitting: Family Medicine

## 2024-02-01 ENCOUNTER — Other Ambulatory Visit: Payer: Self-pay | Admitting: Family Medicine

## 2024-02-03 ENCOUNTER — Other Ambulatory Visit: Payer: Self-pay | Admitting: Family Medicine
